# Patient Record
Sex: Female | Born: 1945 | Race: White | Hispanic: No | Smoking: Never smoker
Health system: Southern US, Community
[De-identification: ages and names within clinical notes are randomized; demographics above are authoritative.]

## PROBLEM LIST (undated history)

## (undated) DIAGNOSIS — I10 Essential (primary) hypertension: Secondary | ICD-10-CM

## (undated) DIAGNOSIS — E785 Hyperlipidemia, unspecified: Secondary | ICD-10-CM

## (undated) DIAGNOSIS — Z8249 Family history of ischemic heart disease and other diseases of the circulatory system: Secondary | ICD-10-CM

## (undated) DIAGNOSIS — Z841 Family history of disorders of kidney and ureter: Secondary | ICD-10-CM

## (undated) DIAGNOSIS — I639 Cerebral infarction, unspecified: Secondary | ICD-10-CM

## (undated) DIAGNOSIS — R9431 Abnormal electrocardiogram [ECG] [EKG]: Secondary | ICD-10-CM

## (undated) HISTORY — DX: Abnormal electrocardiogram (ECG) (EKG): R94.31

## (undated) HISTORY — DX: Family history of ischemic heart disease and other diseases of the circulatory system: Z82.49

## (undated) HISTORY — DX: Family history of disorders of kidney and ureter: Z84.1

## (undated) HISTORY — DX: Essential (primary) hypertension: I10

## (undated) HISTORY — DX: Cerebral infarction, unspecified: I63.9

## (undated) HISTORY — DX: Hyperlipidemia, unspecified: E78.5

---
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
## 2017-08-30 DIAGNOSIS — I639 Cerebral infarction, unspecified: Secondary | ICD-10-CM

## 2017-08-30 HISTORY — DX: Cerebral infarction, unspecified: I63.9

## 2017-09-12 ENCOUNTER — Observation Stay (HOSPITAL_COMMUNITY)
Admission: EM | Admit: 2017-09-12 | Discharge: 2017-09-13 | Disposition: A | Discharge status: Home / Self Care | Payer: Medicare Other | Attending: Family Medicine | Admitting: Family Medicine

## 2017-09-12 ENCOUNTER — Inpatient Hospital Stay (HOSPITAL_COMMUNITY): Payer: Medicare Other

## 2017-09-12 ENCOUNTER — Encounter (HOSPITAL_COMMUNITY): Payer: Self-pay

## 2017-09-12 ENCOUNTER — Emergency Department (HOSPITAL_COMMUNITY): Payer: Medicare Other

## 2017-09-12 ENCOUNTER — Other Ambulatory Visit: Payer: Self-pay

## 2017-09-12 DIAGNOSIS — Z841 Family history of disorders of kidney and ureter: Secondary | ICD-10-CM | POA: Insufficient documentation

## 2017-09-12 DIAGNOSIS — Z8249 Family history of ischemic heart disease and other diseases of the circulatory system: Secondary | ICD-10-CM | POA: Insufficient documentation

## 2017-09-12 DIAGNOSIS — Z8673 Personal history of transient ischemic attack (TIA), and cerebral infarction without residual deficits: Secondary | ICD-10-CM | POA: Diagnosis present

## 2017-09-12 DIAGNOSIS — R262 Difficulty in walking, not elsewhere classified: Secondary | ICD-10-CM | POA: Diagnosis not present

## 2017-09-12 DIAGNOSIS — I63422 Cerebral infarction due to embolism of left anterior cerebral artery: Principal | ICD-10-CM | POA: Insufficient documentation

## 2017-09-12 DIAGNOSIS — E785 Hyperlipidemia, unspecified: Secondary | ICD-10-CM | POA: Insufficient documentation

## 2017-09-12 DIAGNOSIS — R9431 Abnormal electrocardiogram [ECG] [EKG]: Secondary | ICD-10-CM | POA: Insufficient documentation

## 2017-09-12 DIAGNOSIS — G8191 Hemiplegia, unspecified affecting right dominant side: Secondary | ICD-10-CM | POA: Insufficient documentation

## 2017-09-12 DIAGNOSIS — I639 Cerebral infarction, unspecified: Secondary | ICD-10-CM

## 2017-09-12 DIAGNOSIS — R531 Weakness: Secondary | ICD-10-CM | POA: Diagnosis present

## 2017-09-12 DIAGNOSIS — I10 Essential (primary) hypertension: Secondary | ICD-10-CM | POA: Insufficient documentation

## 2017-09-12 HISTORY — DX: Cerebral infarction, unspecified: I63.9

## 2017-09-12 LAB — COMPREHENSIVE METABOLIC PANEL
ALBUMIN: 4.4 g/dL (ref 3.5–5.0)
ALT: 17 U/L (ref 14–54)
AST: 25 U/L (ref 15–41)
Alkaline Phosphatase: 78 U/L (ref 38–126)
Anion gap: 11 (ref 5–15)
BUN: 9 mg/dL (ref 6–20)
CALCIUM: 9.5 mg/dL (ref 8.9–10.3)
CO2: 22 mmol/L (ref 22–32)
CREATININE: 0.77 mg/dL (ref 0.44–1.00)
Chloride: 107 mmol/L (ref 101–111)
GFR calc Af Amer: >60 mL/min (ref >60)
GFR calc non Af Amer: >60 mL/min (ref >60)
Glucose, Bld: 112 mg/dL — ABNORMAL HIGH (ref 65–99)
Potassium: 3.9 mmol/L (ref 3.5–5.1)
SODIUM: 140 mmol/L (ref 135–145)
Total Bilirubin: 0.7 mg/dL (ref 0.3–1.2)
Total Protein: 8 g/dL (ref 6.5–8.1)

## 2017-09-12 LAB — I-STAT CHEM 8, ED
BUN: 11 mg/dL (ref 6–20)
CALCIUM ION: 1.13 mmol/L — AB (ref 1.15–1.40)
CHLORIDE: 106 mmol/L (ref 101–111)
CREATININE: 0.6 mg/dL (ref 0.44–1.00)
GLUCOSE: 108 mg/dL — AB (ref 65–99)
HCT: 41 % (ref 36.0–46.0)
HEMOGLOBIN: 13.9 g/dL (ref 12.0–15.0)
POTASSIUM: 3.9 mmol/L (ref 3.5–5.1)
Sodium: 142 mmol/L (ref 135–145)
TCO2: 26 mmol/L (ref 22–32)

## 2017-09-12 LAB — CBC
HCT: 41.9 % (ref 36.0–46.0)
Hemoglobin: 13.2 g/dL (ref 12.0–15.0)
MCH: 26.2 pg (ref 26.0–34.0)
MCHC: 31.5 g/dL (ref 30.0–36.0)
MCV: 83.3 fL (ref 78.0–100.0)
PLATELETS: 247 10*3/uL (ref 150–400)
RBC: 5.03 MIL/uL (ref 3.87–5.11)
RDW: 14.1 % (ref 11.5–15.5)
WBC: 6.7 10*3/uL (ref 4.0–10.5)

## 2017-09-12 LAB — DIFFERENTIAL
Basophils Absolute: 0 10*3/uL (ref 0.0–0.1)
Basophils Relative: 0 %
EOS ABS: 0 10*3/uL (ref 0.0–0.7)
EOS PCT: 0 %
Lymphocytes Relative: 14 %
Lymphs Abs: 0.9 10*3/uL (ref 0.7–4.0)
Monocytes Absolute: 0.2 10*3/uL (ref 0.1–1.0)
Monocytes Relative: 2 %
NEUTROS PCT: 84 %
Neutro Abs: 5.6 10*3/uL (ref 1.7–7.7)

## 2017-09-12 LAB — I-STAT TROPONIN, ED: TROPONIN I, POC: 0 ng/mL (ref 0.00–0.08)

## 2017-09-12 LAB — PROTIME-INR
INR: 0.93
Prothrombin Time: 12.4 seconds (ref 11.4–15.2)

## 2017-09-12 LAB — APTT: aPTT: 28 seconds (ref 24–36)

## 2017-09-12 LAB — TROPONIN I: Troponin I: <0.03 ng/mL (ref <0.03)

## 2017-09-12 LAB — CK: Total CK: 80 U/L (ref 38–234)

## 2017-09-12 MED ORDER — ASPIRIN 81 MG PO CHEW
81.0000 mg | CHEWABLE_TABLET | Freq: Every day | ORAL | Status: DC
  Filled 2017-09-12: qty 1

## 2017-09-12 MED ORDER — ENOXAPARIN SODIUM 40 MG/0.4ML ~~LOC~~ SOLN
40.0000 mg | SUBCUTANEOUS | Status: DC
  Administered 2017-09-12: 40 mg via SUBCUTANEOUS
  Filled 2017-09-12: qty 0.4

## 2017-09-12 MED ORDER — ASPIRIN 300 MG RE SUPP: 300.0000 mg | Freq: Every day | RECTAL | Status: DC

## 2017-09-12 MED ORDER — ATORVASTATIN CALCIUM 40 MG PO TABS
40.0000 mg | ORAL_TABLET | Freq: Every day | ORAL | Status: DC
  Filled 2017-09-12: qty 1

## 2017-09-12 MED ORDER — GADOBENATE DIMEGLUMINE 529 MG/ML IV SOLN
10.0000 mL | Freq: Once | INTRAVENOUS | Status: AC | PRN
  Administered 2017-09-12: 10 mL via INTRAVENOUS

## 2017-09-12 MED ORDER — STROKE: EARLY STAGES OF RECOVERY BOOK
Freq: Once | Status: AC
  Administered 2017-09-13
  Filled 2017-09-12 (×2): qty 1

## 2017-09-12 MED ORDER — ASPIRIN 325 MG PO TABS
325.0000 mg | ORAL_TABLET | Freq: Every day | ORAL | Status: DC
  Administered 2017-09-13: 325 mg via ORAL
  Filled 2017-09-12: qty 1

## 2017-09-12 NOTE — ED Triage Notes (Signed)
Pt states she went to bed last night at about 11pm and felt normal. When she woke up at 0300 to go to the restroom she noticed she had some right sided weakness and was dragging right leg when walking. Denies and numbness or tingling, visual changes, confusion. Endorses nausea. Some right arm drift noted in triage. No facial droop. Speech clear.

## 2017-09-12 NOTE — Consult Note (Signed)
Referring Physician: Zenia Resides, MD    Chief Complaint: Stroke Symptoms   HPI: Heather Robbins is an 72 y.o. female without any significant past medical history who presented to the ED with complaints of right sided weakness and ataxia.  Patient was in her usual state of health, plays ball daily; patient went to bed around 9 PM.  She woke up around 3 AM and noticed that she was having some right arm and right leg weakness, was favoring her right side with unsteady gait.  Associated with it was nausea and some dizziness but denies blurred vision or headache.  She sat in a chair woke up this morning around 730am with persistence of symptoms and ataxia.  She was trying to write and noted incoordination of her right hand, especially with fine motor skills.  She drove herself to urgent care, where she was urged to come to the ER.  She then drove here and walked to the ER, with ataxic gait and dragging of her right foot.    On admission initial CT was concerning for acute infarct in the left basal ganglia.  Neurology was consulted for evaluation of stroke.  On assessment patient is awake alert oriented x 3 with deficits of right sided ataxia, unsteady gait and right arm weakness. She denies any recent visual pain or visual loss, fevers, chills, headaches, trauma, blurred vision, chest pain, shortness of breath, vomiting and has not had any previous similar symptoms.  She admits to her maternal grandmother having history of stroke.   She has no history of autoimmune disease, no history of optic neuritis and has not had similar symptoms in the past. There is no family history of MS.    History reviewed. No pertinent past medical history.  Past Surgical History:  Procedure Laterality Date  . CESAREAN SECTION      Family History  Problem Relation Age of Onset  . Heart attack Mother   . Diabetes Mother   . Emphysema Father   . Asthma Father   . Heart attack Father   . Heart attack Maternal  Grandmother    Social History:  reports that  has never smoked. she has never used smokeless tobacco. She reports that she does not drink alcohol or use drugs.  Allergies: No Known Allergies  Medications:  .  stroke: mapping our early stages of recovery book   Does not apply Once  . aspirin  81 mg Oral Daily  . atorvastatin  40 mg Oral q1800    ROS: 13 point systems reviewed and are negative except for above  Physical Examination: Blood pressure (!) 189/89, pulse 88, temperature 98.6 F (37 C), temperature source Oral, resp. rate (!) 23, SpO2 96 %. HEENT-  Normocephalic, no lesions, without obvious abnormality.  Normal external eye and conjunctiva.   Cardiovascular- S1-S2 audible, pulses palpable throughout   Lungs-no rhonchi or wheezing noted, no excessive working breathing.  Saturations within normal limits Abdomen- All 4 quadrants palpated and nontender Musculoskeletal-no joint tenderness, deformity or swelling Skin-warm and dry  Neurological Examination Mental Status: Alert, oriented, thought content appropriate.  Speech fluent without evidence of aphasia.  Able to follow 3 step commands without difficulty. Cranial Nerves: II: Discs flat bilaterally; Visual fields grossly normal,  III,IV, VI: ptosis not present, extra-ocular motions intact bilaterally pupils equal, round, reactive to light and accommodation V,VII: smile symmetric, facial light touch sensation normal bilaterally VIII: Hearing intact to voice IX,X: uvula rises symmetrically XI: bilateral shoulder shrug XII: midline  tongue extension Motor: Mild weakness in the right upper extremities with noted drift. Right : Upper extremity   4/5    Left:     Upper extremity   5/5  Lower extremity   5/5     Lower extremity   5/5 Tone and bulk:normal tone throughout; no atrophy noted Sensory: Pinprick and light touch intact throughout, bilaterally Deep Tendon Reflexes: 3+ and symmetric throughout Plantars: Right:  downgoing  Left: downgoing Cerebellar: Ataxic right finger-to-nose, normal rapid alternating movements, normal orbiting. Ataxic right leg heel-to-shin test Gait: Drags right leg with ataxic gait  NIHSS 1a Level of Conscious.: 0 1b LOC Questions: 0 1c LOC Commands: 0 2 Best Gaze: 0 3 Visual: 0 4 Facial Palsy: 0 5a Motor Arm - left: 0 5b Motor Arm - Right: 1 6a Motor Leg - Left: 0 6b Motor Leg - Right: 0 7 Limb Ataxia: 2 8 Sensory: 0 9 Best Language: 0 10 Dysarthria: 0 11 Extinct. and Inatten.: 0 TOTAL: 3  Results for orders placed or performed during the hospital encounter of 09/12/17 (from the past 48 hour(s))  Protime-INR     Status: None   Collection Time: 09/12/17 12:33 PM  Result Value Ref Range   Prothrombin Time 12.4 11.4 - 15.2 seconds   INR 0.93     Comment: Performed at [facility], [place] [address] [place], [place] [address]  APTT     Status: None   Collection Time: 09/12/17 12:33 PM  Result Value Ref Range   aPTT 28 24 - 36 seconds    Comment: Performed at [facility] [address] [place] [address]  CBC     Status: None   Collection Time: 09/12/17 12:33 PM  Result Value Ref Range   WBC 6.7 4.0 - 10.5 K/uL   RBC 5.03 3.87 - 5.11 MIL/uL   Hemoglobin 13.2 12.0 - 15.0 g/dL   HCT 41.9 36.0 - 46.0 %   MCV 83.3 78.0 - 100.0 fL   MCH 26.2 26.0 - 34.0 pg   MCHC 31.5 30.0 - 36.0 g/dL   RDW 14.1 11.5 - 15.5 %   Platelets 247 150 - 400 K/uL    Comment: Performed at [facility] [address] [place], [place] [address]  Differential     Status: None   Collection Time: 09/12/17 12:33 PM  Result Value Ref Range   Neutrophils Relative % 84 %   Neutro Abs 5.6 1.7 - 7.7 K/uL   Lymphocytes Relative 14 %   Lymphs Abs 0.9 0.7 - 4.0 K/uL   Monocytes Relative 2 %   Monocytes Absolute 0.2 0.1 - 1.0 K/uL   Eosinophils Relative 0 %   Eosinophils Absolute 0.0 0.0 - 0.7 K/uL   Basophils Relative 0 %   Basophils Absolute 0.0 0.0 - 0.1 K/uL     Comment: Performed at [facility] [address] [place], [place] [address]  Comprehensive metabolic panel     Status: Abnormal   Collection Time: 09/12/17 12:33 PM  Result Value Ref Range   Sodium 140 135 - 145 mmol/L   Potassium 3.9 3.5 - 5.1 mmol/L   Chloride 107 101 - 111 mmol/L   CO2 22 22 - 32 mmol/L   Glucose, Bld 112 (H) 65 - 99 mg/dL   BUN 9 6 - 20 mg/dL   Creatinine, Ser 0.77 0.44 - 1.00 mg/dL   Calcium 9.5 8.9 - 10.3 mg/dL   Total  Protein 8.0 6.5 - 8.1 g/dL   Albumin 4.4 3.5 - 5.0 g/dL   AST 25 15 - 41 U/L   ALT 17 14 - 54 U/L   Alkaline Phosphatase 78 38 - 126 U/L   Total Bilirubin 0.7 0.3 - 1.2 mg/dL   GFR calc non Af Amer >60 >60 mL/min   GFR calc Af Amer >60 >60 mL/min    Comment: (NOTE) The eGFR has been calculated using the CKD EPI equation. This calculation has not been validated in all clinical situations. eGFR's persistently <60 mL/min signify possible Chronic Kidney Disease.    Anion gap 11 5 - 15    Comment: Performed at [facility] [address], [place], [place] [address]  I-stat troponin, ED     Status: None   Collection Time: 09/12/17 12:51 PM  Result Value Ref Range   Troponin i, poc 0.00 0.00 - 0.08 ng/mL   Comment 3            Comment: Due to the release kinetics of cTnI, a negative result within the first hours of the onset of symptoms does not rule out myocardial infarction with certainty. If myocardial infarction is still suspected, repeat the test at appropriate intervals.   I-Stat Chem 8, ED     Status: Abnormal   Collection Time: 09/12/17 12:57 PM  Result Value Ref Range   Sodium 142 135 - 145 mmol/L   Potassium 3.9 3.5 - 5.1 mmol/L   Chloride 106 101 - 111 mmol/L   BUN 11 6 - 20 mg/dL   Creatinine, Ser 0.60 0.44 - 1.00 mg/dL   Glucose, Bld 108 (H) 65 - 99 mg/dL   Calcium, Ion 1.13 (L) 1.15 - 1.40 mmol/L   TCO2 26 22 - 32 mmol/L   Hemoglobin 13.9 12.0 - 15.0 g/dL   HCT 41.0 36.0 - 46.0 %   Ct Head Wo  Contrast 09/12/2017 IMPRESSION:  1. Findings concerning for acute infarct in the left basal ganglia.   MRI brain:  1. Small focus of acute ischemia at the left frontoparietal junction, at the base of the central sulcus. No associated hemorrhage or mass effect. 2. Diffuse confluent white matter disease. 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 Severe chronic demyelinating disease could have this appearance, but the features are not particularly suggestive of this and there is no abnormal contrast enhancement. 3. No emergent large vessel occlusion or high-grade intracranial or cervical arterial stenosis.  MRI cervical spine: 1. No focal spinal cord lesion or evidence of demyelination. 2. 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  EKG: NSR, biatrial enlargement  Assessment: 72 y.o. female without any significant past medical history who presented to the ED with complaints of acute onset right sided weakness and ataxia. 1. MRI reveals a small focus of acute ischemia at the left frontoparietal junction, at the base of the central sulcus. No abnormal enhancement is appreciated. Severe chronic microvascular ischemic changes are also noted. MRA shows no emergent large vessel occlusion or high-grade intracranial or cervical arterial stenosis. 2. MRI findings correspond with the patient's symptoms of right-sided weakness, right-sided ataxia, and unsteady gait.   3. Patient with no significant stroke risk factors, but does have family history of stroke.  She denies recent heart fluttering.  At this time due to patient's focus of acute ischemia on MRI, we will pursue full stroke workup  with MRI of the brain, MRA of the head and neck, echocardiogram and carotid Dopplers.  We will start PT, OT and speech therapy.  We will start aspirin 81 mg as well  as intermediate dose statin for now while pending lipid panel.   4. Permissive hypertension for this first 24-48 hours per stroke protocol. 5. Stroke Risk Factors - family history  Plan: - HgbA1c, fasting lipid panel pending - PT consult, OT consult, Speech consult - Echocardiogram - Carotid dopplers - Prophylactic therapy-Antiplatelet med: Aspirin - dose 97m - Atorvastatin 411mfor now pending lipid panel - Risk factor modification - Telemetry monitoring -  Frequent neuro checks - Permissive hypertension x24 - 48hrs   PeJacob MooresNP Neuro-hospitalist Team [phone]/14/2019, 3:53 PM   Electronically signed: Dr. ErKerney Elbe

## 2017-09-12 NOTE — ED Notes (Signed)
Neuro Provider at bedside. °

## 2017-09-12 NOTE — ED Notes (Signed)
Pt transported to MRI 

## 2017-09-12 NOTE — ED Provider Notes (Signed)
[facility] EMERGENCY DEPARTMENT Provider Note   CSN: [id number] Arrival date & time: 09/12/17  1221     History   Chief Complaint Chief Complaint  Patient presents with  . Stroke Symptoms    HPI Shenise Wolgamott is a 72 y.o. female.  The history is provided by the patient. No language interpreter was used.   Naveh Rickles is a 72 y.o. female who presents to the Emergency Department complaining of stroke symptoms.  Routine state of health yesterday and even played a ball game.  She went to bed around 9 PM.  When she woke at 3 she noticed that she was having some right arm and leg weakness as well as nauseousness and unsteady gait.  No reports of fevers, chest pain, abdominal pain, vomiting, vision changes or headache.  No prior similar symptoms.  She has no medical problems and takes a baby aspirin daily.  Sxs are moderate, constant.   History reviewed. No pertinent past medical history.  Patient Active Problem List   Diagnosis Date Noted  . Stroke ([facility]) 09/12/2017  . Family history of MI (myocardial infarction)   . Family history of chronic renal disease   . ST segment depression     Past Surgical History:  Procedure Laterality Date  . CESAREAN SECTION      OB History    No data available       Home Medications    Prior to Admission medications   Not on File    Family History Family History  Problem Relation Age of Onset  . Heart attack Mother   . Diabetes Mother   . Emphysema Father   . Asthma Father   . Heart attack Father   . Heart attack Maternal Grandmother     Social History Social History   Tobacco Use  . Smoking status: Never Smoker  . Smokeless tobacco: Never Used  Substance Use Topics  . Alcohol use: No    Frequency: Never  . Drug use: No     Allergies   Patient has no known allergies.   Review of Systems Review of Systems  All other systems reviewed and are negative.    Physical Exam Updated Vital Signs BP (!)  189/89   Pulse 88   Temp 98.6 F (37 C) (Oral)   Resp (!) 23   SpO2 96%   Physical Exam  Constitutional: She is oriented to person, place, and time. She appears well-developed and well-nourished.  HENT:  Head: Normocephalic and atraumatic.  Cardiovascular: Normal rate and regular rhythm.  No murmur heard. Pulmonary/Chest: Effort normal and breath sounds normal. No respiratory distress.  Abdominal: Soft. There is no tenderness. There is no rebound and no guarding.  Musculoskeletal: She exhibits no edema or tenderness.  Neurological: She is alert and oriented to person, place, and time.  No facial asymmetry.  Visual fields grossly intact.  There is slight pronator drift of the right upper extremity.  4+ out of 5 right upper extremity strength.  4+ out of 5 right lower extremity strength.  There is mild ataxia on finger to nose with the right upper extremity.  Skin: Skin is warm and dry.  Psychiatric: She has a normal mood and affect. Her behavior is normal.  Nursing note and vitals reviewed.    ED Treatments / Results  Labs (all labs ordered are listed, but only abnormal results are displayed) Labs Reviewed  COMPREHENSIVE METABOLIC PANEL - Abnormal; Notable for the following components:  Result Value   Glucose, Bld 112 (*)    All other components within normal limits  I-STAT CHEM 8, ED - Abnormal; Notable for the following components:   Glucose, Bld 108 (*)    Calcium, Ion 1.13 (*)    All other components within normal limits  PROTIME-INR  APTT  CBC  DIFFERENTIAL  TROPONIN I  LIPID PANEL  I-STAT TROPONIN, ED    EKG  EKG Interpretation  Date/Time:  Thursday September 12 2017 12:30:18 EST Ventricular Rate:  90 PR Interval:  144 QRS Duration: 80 QT Interval:  360 QTC Calculation: 440 R Axis:   90 Text Interpretation:  Normal sinus rhythm Biatrial enlargement Rightward axis Pulmonary disease pattern ST & T wave abnormality, consider lateral ischemia Abnormal ECG no  prior available for comparison Confirmed by Quintella Reichert [phone]) on 09/12/2017 2:39:25 PM       Radiology Ct Head Wo Contrast  Result Date: 09/12/2017 CLINICAL DATA:  Right-sided weakness. EXAM: CT HEAD WITHOUT CONTRAST TECHNIQUE: Contiguous axial images were obtained from the base of the skull through the vertex without intravenous contrast. COMPARISON:  None. FINDINGS: Brain: There is loss of the normal gray-white matter differentiation within the left basal ganglia, with surrounding hypodensity likely reflecting edema. No hemorrhage, hydrocephalus, extra-axial collection or mass lesion/mass effect. Mild age related cerebral atrophy. Extensive periventricular white matter hypodensity is nonspecific, but likely reflect chronic microvascular ischemic changes. Vascular: Atherosclerotic vascular calcification of the carotid siphons. No hyperdense vessel. Skull: Normal. Negative for fracture or focal lesion. Sinuses/Orbits: No acute finding. Other: None. IMPRESSION: 1. Findings concerning for acute infarct in the left basal ganglia. These results were called by telephone at the time of interpretation on 09/12/2017 at 12:56 pm to Dr. Duffy Bruce , who verbally acknowledged these results. Electronically Signed   By: Titus Dubin M.D.   On: 09/12/2017 13:00    Procedures Procedures (including critical care time)  Medications Ordered in ED Medications   stroke: mapping our early stages of recovery book (not administered)  aspirin chewable tablet 81 mg (not administered)  atorvastatin (LIPITOR) tablet 40 mg (not administered)     Initial Impression / Assessment and Plan / ED Course  I have reviewed the triage vital signs and the nursing notes.  Pertinent labs & imaging results that were available during my care of the patient were reviewed by me and considered in my medical decision making (see chart for details).     In here for evaluation of right-sided weakness that started when she  woke at 3 AM.  Imaging does demonstrate an acute basal ganglia infarct and examination is consistent with this.  She is not a TPA candidate given duration of symptoms.  Neurology consulted regarding acute CVA.  Medicine consulted for admission for further treatment.  Patient updated of findings of studies and she is in agreement with admission.  Final Clinical Impressions(s) / ED Diagnoses   Final diagnoses:  Acute CVA (cerebrovascular accident) [facility])    ED Discharge Orders    None       Quintella Reichert, MD 09/12/17 1538

## 2017-09-12 NOTE — Progress Notes (Signed)
Pt arrived at 3w

## 2017-09-12 NOTE — H&P (Signed)
[facility] Admission History and Physical Service Pager: [phone]  Patient name: Heather Robbins Medical record number: [id number] Date of birth: [birth year] Age: 72 y.o. Gender: female  Primary Care Provider: No primary care provider on file. Consultants: Neurology Code Status: Partial (No intubation), confirmed on admission  Chief Complaint: Right-sided weakness  Assessment and Plan: Heather Robbins is a 72 y.o. female presenting with right-sided weakness and difficulty walking. No significant PMH.  Acute CVA of left basal ganglia. CT head significant for for acute infarct of left basal ganglia without hemorrhage.  Last known normal was last night around 11pm, woke up around 3am with R foot dragging and R hand weakness.  Patient presented outside the window for TPA.  Minimal R sided deficits with 4.5/5 strength in R UE and LE as well R sided finger-to-nose and heel-to-shin. Work up so far significant for EKG nonspecific ST changes in II, III, aVF and possibly in V4-6.  No prior EKG for comparison. Patient does not endorse any chest pain or shortness of breath.  Currently hypertensive at 180/90.  I-STAT troponin was negative.  Not likely ACS given patient has no chest pain or shortness of breath. -Admit to inpatient, attending Dr. Andria Frames -cardiac monitoring -MRI brain/MRA head/MRA neck -Echocardiogram -Carotid Dopplers -Risk stratification labs: TSH, A1c, lipid panel -start ASA81 and atorvastatin 40mg  qd  -Neurology consulted in ED, appreciate recommendations -Permissive hypertension -Telemetry -Vital signs per floor -PT/OT/SLP -A.m. CBC, BMP -CK  -Troponin I x1, if negative will not trend -am EKG   FEN/GI: N.p.o. pending swallow evaluation Prophylaxis: Lovenox  Disposition: Inpatient evaluation of stroke  History of Present Illness:  Heather Robbins is a 72 y.o. female presenting 1 day of right-sided weakness and difficulty walking. Yesterday evening patient  was playing softball and was her normal self when she went to bed.  Patient awoke at 3:00 am during her usual time, but said she didn't feel right.  She said that her coordination was not the "best" and that her balance was not "100%".  She said that she noticed that she was dragging her right foot around when walking.  She also noticed that she had difficulty writing with her right hand.  Patient is right-handed.  Patient denies ever having anything happen like this before.  Denies any medical history of stroke, high blood pressure, high cholesterol, diabetes, heart disease, heart attack.  She does not have a regular doctor currently and goes to urgent care, she states she last saw a PCP about a year ago.  She denies taking any medications. Patient has not fallen since this event.  She did eat some applesauce and orange juice this morning without any difficulty.  She came to the ED on her own because she was not feeling well and her symptoms did not improve.  In the ED, code stroke was initiated.  On admission, vital signs were significant for hypertension to 180/90. Patient CBC and CMP were unremarkable.  Patient had negative I stat troponin.  EKG was significant for ST depression in lateral leads and 2, 3, and aVF.  CT head was significant for acute infarct of the left basal ganglia.  Neurology was consulted in the ED.  They recommended permissive hypertension and admission for full stroke workup per the protocol.  Review Of Systems: Per HPI with the following additions:   Review of Systems  Constitutional: Negative for chills and fever.  HENT: Negative for congestion, sinus pain and sore throat.   Eyes: Negative for  blurred vision, double vision and pain.  Respiratory: Negative for cough and shortness of breath.   Cardiovascular: Negative for chest pain and palpitations.  Gastrointestinal: Positive for nausea. Negative for abdominal pain, constipation, diarrhea and vomiting.  Genitourinary: Negative  for dysuria.  Musculoskeletal: Negative for falls.  Neurological: Positive for focal weakness, weakness and headaches. Negative for dizziness, tingling and sensory change.  Psychiatric/Behavioral: Negative for substance abuse. The patient has insomnia.     Patient Active Problem List   Diagnosis Date Noted  . Stroke ([place]) 09/12/2017  . Family history of MI (myocardial infarction)   . Family history of chronic renal disease   . ST segment depression     Past Medical History: History reviewed. No pertinent past medical history.  Past Surgical History: Past Surgical History:  Procedure Laterality Date  . CESAREAN SECTION      Social History: Social History   Tobacco Use  . Smoking status: Never Smoker  . Smokeless tobacco: Never Used  Substance Use Topics  . Alcohol use: No    Frequency: Never  . Drug use: No   Additional social history:  Only hospitalization once in 1985 for C-section. No other hospitalizations.  She does not see PCP regularly. No smoking, drinking, no drugs.  She lives alone and is independent performing all of her daily functions of living without difficulty.  She lives in [place].  She has 1 daughter.   Please also refer to relevant sections of EMR.  Family History: Family History  Problem Relation Age of Onset  . Heart attack Mother   . Diabetes Mother   . Emphysema Father   . Asthma Father   . Heart attack Father   . Heart attack Maternal Grandmother    Grandmother and mother had heart attack Mom had on dialysis Father - heart attack Daughter is healthy.    Allergies and Medications: No Known Allergies No current facility-administered medications on file prior to encounter.    No current outpatient medications on file prior to encounter.    Objective: BP (!) 189/89   Pulse 88   Temp 98.6 F (37 C) (Oral)   Resp (!) 23   SpO2 96%  Exam: General: No acute distress, sitting up in bed Eyes: PERRLA, EOMI, anicteric, noninjected,  no peripheral field defects noted on exam ENTM: No oral pharyngeal lesions, moist mucous membranes Neck: No JVD, supple, no lymphadenopathy Cardiovascular: Regular rate and rhythm, no murmurs rubs or gallops Respiratory: Clear to auscultation bilaterally, normal work of breathing Gastrointestinal: Soft nontender nondistended MSK: 4/5 strength right upper extremity with flexion, 5/5 right sided strength with extension, 5/5 strength in left upper extremity with flexion and extension,  5 of 5 strength in lower extremities bilaterally Derm: Warm dry intact Neuro: Cranial nerves II through XII are grossly intact, abnormal finger to nose on right side, normal finger to nose on left, mild discordance with heel to shin on right side, normal heel to shin on left side Psych: Normal mood and affect  Labs and Imaging: CBC BMET  Recent Labs  Lab 09/12/17 1233 09/12/17 1257  WBC 6.7  --   HGB 13.2 13.9  HCT 41.9 41.0  PLT 247  --    Recent Labs  Lab 09/12/17 1233 09/12/17 1257  NA 140 142  K 3.9 3.9  CL 107 106  CO2 22  --   BUN 9 11  CREATININE 0.77 0.60  GLUCOSE 112* 108*  CALCIUM 9.5  --  Troponin (Point of Care Test) Recent Labs    09/12/17 1251  TROPIPOC 0.00    Ct Head Wo Contrast  Result Date: 09/12/2017 CLINICAL DATA:  Right-sided weakness. EXAM: CT HEAD WITHOUT CONTRAST TECHNIQUE: Contiguous axial images were obtained from the base of the skull through the vertex without intravenous contrast. COMPARISON:  None. FINDINGS: Brain: There is loss of the normal gray-white matter differentiation within the left basal ganglia, with surrounding hypodensity likely reflecting edema. No hemorrhage, hydrocephalus, extra-axial collection or mass lesion/mass effect. Mild age related cerebral atrophy. Extensive periventricular white matter hypodensity is nonspecific, but likely reflect chronic microvascular ischemic changes. Vascular: Atherosclerotic vascular calcification of the carotid  siphons. No hyperdense vessel. Skull: Normal. Negative for fracture or focal lesion. Sinuses/Orbits: No acute finding. Other: None. IMPRESSION: 1. Findings concerning for acute infarct in the left basal ganglia. These results were called by telephone at the time of interpretation on 09/12/2017 at 12:56 pm to Dr. Duffy Bruce , who verbally acknowledged these results. Electronically Signed   By: Titus Dubin M.D.   On: 09/12/2017 13:00     Bonnita Hollow, MD 09/12/2017, 3:41 PM PGY-1, [facility] Intern pager: [phone], text pages welcome  FPTS Upper-Level Resident Addendum  I have independently interviewed and examined the patient. I have discussed the above with the original author and agree with their documentation. My edits for correction/addition/clarification are in blue. Please see also any attending notes.   Bufford Lope, DO PGY-2, [facility] 09/12/2017 4:16 PM  FPTS Service pager: [phone] (text pages welcome through [facility])

## 2017-09-12 NOTE — ED Notes (Signed)
Pt returned from MRI. Pt being transported to 3w at this time.

## 2017-09-13 ENCOUNTER — Inpatient Hospital Stay (HOSPITAL_BASED_OUTPATIENT_CLINIC_OR_DEPARTMENT_OTHER): Payer: Medicare Other

## 2017-09-13 ENCOUNTER — Other Ambulatory Visit: Payer: Self-pay

## 2017-09-13 DIAGNOSIS — Z8673 Personal history of transient ischemic attack (TIA), and cerebral infarction without residual deficits: Secondary | ICD-10-CM

## 2017-09-13 DIAGNOSIS — I63422 Cerebral infarction due to embolism of left anterior cerebral artery: Secondary | ICD-10-CM | POA: Diagnosis not present

## 2017-09-13 DIAGNOSIS — I63313 Cerebral infarction due to thrombosis of bilateral middle cerebral arteries: Secondary | ICD-10-CM | POA: Diagnosis not present

## 2017-09-13 DIAGNOSIS — I639 Cerebral infarction, unspecified: Secondary | ICD-10-CM

## 2017-09-13 DIAGNOSIS — E785 Hyperlipidemia, unspecified: Secondary | ICD-10-CM | POA: Diagnosis not present

## 2017-09-13 DIAGNOSIS — I10 Essential (primary) hypertension: Secondary | ICD-10-CM | POA: Diagnosis not present

## 2017-09-13 HISTORY — DX: Personal history of transient ischemic attack (TIA), and cerebral infarction without residual deficits: Z86.73

## 2017-09-13 LAB — RAPID URINE DRUG SCREEN, HOSP PERFORMED
AMPHETAMINES: NOT DETECTED
BENZODIAZEPINES: NOT DETECTED
Barbiturates: NOT DETECTED
COCAINE: NOT DETECTED
OPIATES: NOT DETECTED
TETRAHYDROCANNABINOL: NOT DETECTED

## 2017-09-13 LAB — BASIC METABOLIC PANEL
Anion gap: 12 (ref 5–15)
BUN: 9 mg/dL (ref 6–20)
CALCIUM: 9.3 mg/dL (ref 8.9–10.3)
CHLORIDE: 103 mmol/L (ref 101–111)
CO2: 27 mmol/L (ref 22–32)
CREATININE: 0.79 mg/dL (ref 0.44–1.00)
GFR calc Af Amer: >60 mL/min (ref >60)
GFR calc non Af Amer: >60 mL/min (ref >60)
GLUCOSE: 95 mg/dL (ref 65–99)
Potassium: 3.8 mmol/L (ref 3.5–5.1)
Sodium: 142 mmol/L (ref 135–145)

## 2017-09-13 LAB — TSH: TSH: 2.553 u[IU]/mL (ref 0.350–4.500)

## 2017-09-13 LAB — CBC WITH DIFFERENTIAL/PLATELET
BASOS PCT: 0 %
Basophils Absolute: 0 10*3/uL (ref 0.0–0.1)
EOS ABS: 0 10*3/uL (ref 0.0–0.7)
Eosinophils Relative: 1 %
HEMATOCRIT: 39.8 % (ref 36.0–46.0)
HEMOGLOBIN: 12.4 g/dL (ref 12.0–15.0)
LYMPHS ABS: 1.3 10*3/uL (ref 0.7–4.0)
Lymphocytes Relative: 24 %
MCH: 26 pg (ref 26.0–34.0)
MCHC: 31.2 g/dL (ref 30.0–36.0)
MCV: 83.4 fL (ref 78.0–100.0)
MONO ABS: 0.4 10*3/uL (ref 0.1–1.0)
MONOS PCT: 7 %
NEUTROS ABS: 3.5 10*3/uL (ref 1.7–7.7)
NEUTROS PCT: 68 %
Platelets: 223 10*3/uL (ref 150–400)
RBC: 4.77 MIL/uL (ref 3.87–5.11)
RDW: 14.1 % (ref 11.5–15.5)
WBC: 5.1 10*3/uL (ref 4.0–10.5)

## 2017-09-13 LAB — LIPID PANEL
CHOLESTEROL: 260 mg/dL — AB (ref 0–200)
HDL: 46 mg/dL (ref >40)
LDL Cholesterol: 198 mg/dL — ABNORMAL HIGH (ref 0–99)
Total CHOL/HDL Ratio: 5.7 RATIO
Triglycerides: 82 mg/dL (ref <150)
VLDL: 16 mg/dL (ref 0–40)

## 2017-09-13 LAB — ECHOCARDIOGRAM COMPLETE
EERAT: 7.74
EWDT: 338 ms
FS: 38 % (ref 28–44)
IV/PV OW: 1.57
LA ID, A-P, ES: 27 mm
LA diam end sys: 27 mm
LA diam index: 1.77 cm/m2
LA vol A4C: 23.5 ml
LAVOL: 29.6 mL
LAVOLIN: 19.4 mL/m2
LV E/e' medial: 7.74
LV E/e'average: 7.74
LV PW d: 5.6 mm — AB (ref 0.6–1.1)
LV TDI E'MEDIAL: 5.77
LV e' LATERAL: 9.46 cm/s
LVOT area: 3.14 cm2
LVOT diameter: 20 mm
Lateral S' vel: 9.68 cm/s
MV Dec: 338
MV pk A vel: 79.5 m/s
MVPG: 2 mmHg
MVPKEVEL: 73.2 m/s
RV TAPSE: 19.9 mm
TDI e' lateral: 9.46

## 2017-09-13 LAB — HEMOGLOBIN A1C
Hgb A1c MFr Bld: 5.8 % — ABNORMAL HIGH (ref 4.8–5.6)
Mean Plasma Glucose: 119.76 mg/dL

## 2017-09-13 MED ORDER — ATORVASTATIN CALCIUM 80 MG PO TABS: 80.0000 mg | ORAL_TABLET | Freq: Every day | ORAL | 0 refills | Status: DC

## 2017-09-13 MED ORDER — ASPIRIN 325 MG PO TABS: 325.0000 mg | ORAL_TABLET | Freq: Every day | ORAL | 0 refills | Status: DC

## 2017-09-13 MED ORDER — ATORVASTATIN CALCIUM 80 MG PO TABS
80.0000 mg | ORAL_TABLET | Freq: Every day | ORAL | Status: DC
  Administered 2017-09-13: 80 mg via ORAL
  Filled 2017-09-13: qty 1

## 2017-09-13 NOTE — Evaluation (Signed)
Occupational Therapy Evaluation Patient Details Name: Heather Robbins MRN: [id number] DOB: 1945-10-27 Today's Date: 09/13/2017    History of Present Illness 72 y.o. female without any significant past medical history who presented to the ED with complaints of right sided weakness and ataxia. MRI reveals a small focus of acute ischemia at the left frontoparietal junction, at the base of the central sulcus.    Clinical Impression   This 72 y/o F presents with the above. Pt lives alone, at baseline is independent with ADLs, iADLs and functional mobility, very active. Pt completing room and hallway level functional mobility, functional transfers without AD with overall supervision-MinGuard during session; Demonstrating LB ADLs with MinGuard assist. Pt presenting with decreased coordination in RUE. Education provided on activities for improving RUE FMC/strengthening and handout provided. Will continue to follow while in acute setting to address higher level balance as well as to review RUE coordination/HEP. Recommend outpatient neuro OT after discharge to further address RUE deficits.     Follow Up Recommendations  Outpatient OT;Supervision - Intermittent(neuro outpatient OT )    Equipment Recommendations  None recommended by OT           Precautions / Restrictions Precautions Precautions: None      Mobility Bed Mobility Overal bed mobility: Modified Independent                Transfers Overall transfer level: Needs assistance Equipment used: None Transfers: Sit to/from Stand Sit to Stand: Supervision         General transfer comment: supervision for safety     Balance Overall balance assessment: Mild deficits observed, not formally tested                                       ADL either performed or assessed with clinical judgement   ADL Overall ADL's : Needs assistance/impaired Eating/Feeding: Modified independent;Sitting   Grooming:  Supervision/safety;Standing   Upper Body Bathing: Supervision/ safety;Sitting   Lower Body Bathing: Min guard;Sit to/from stand   Upper Body Dressing : Supervision/safety;Sitting   Lower Body Dressing: Min guard;Sit to/from stand Lower Body Dressing Details (indicate cue type and reason): Pt easily able to reach and adjust socks while seated EOB  Toilet Transfer: Min guard;Ambulation;Regular Museum/gallery exhibitions officer and Hygiene: Min guard;Sit to/from stand   Tub/ Shower Transfer: Tub transfer;Min guard;Ambulation Tub/Shower Transfer Details (indicate cue type and reason): educated Pt on use of shower seat and to have seat in tub in case of instance of unsteadiness/needing to sit during task completion with Pt verbalizing understanding  Functional mobility during ADLs: Min guard General ADL Comments: MinGuard during hallway level functional mobility as Pt slightly unsteady at times; educated on RUE coordination and strengthening activities/exercises with coordination handout provided and reviewed      Vision Baseline Vision/History: Wears glasses Wears Glasses: Reading only Patient Visual Report: No change from baseline Vision Assessment?: No apparent visual deficits                Pertinent Vitals/Pain Pain Assessment: No/denies pain     Hand Dominance Right   Extremity/Trunk Assessment Upper Extremity Assessment Upper Extremity Assessment: RUE deficits/detail RUE Deficits / Details: ROM WFL, grossly 3+/5, decreased coordination, decreased smoothness/acurracy during finger to nose  RUE Coordination: decreased fine motor   Lower Extremity Assessment Lower Extremity Assessment: Defer to PT evaluation RLE Deficits / Details: 5/5 strength, c/o mild tingling  in foot RLE Sensation: decreased proprioception   Cervical / Trunk Assessment Cervical / Trunk Assessment: Normal   Communication Communication Communication: No difficulties   Cognition  Arousal/Alertness: Awake/alert Behavior During Therapy: WFL for tasks assessed/performed Overall Cognitive Status: Within Functional Limits for tasks assessed                                           Exercises Other Exercises Other Exercises: educated on RUE fine motor coordination activities/exercises and UE ROM/strengthening exercises with pt return demonstrating with min verbal cues          Home Living Family/patient expects to be discharged to:: Private residence Living Arrangements: Alone Available Help at Discharge: Family;Available PRN/intermittently Type of Home: House Home Access: Level entry     Home Layout: One level     Bathroom Shower/Tub: Teacher, early years/pre: Standard     Home Equipment: Cane - single point          Prior Functioning/Environment Level of Independence: Independent        Comments: Very active. Drives. Plays pickle ball daily.         OT Problem List: Decreased coordination;Impaired balance (sitting and/or standing)      OT Treatment/Interventions: Self-care/ADL training;DME and/or AE instruction;Therapeutic activities;Balance training;Therapeutic exercise;Energy conservation;Patient/family education    OT Goals(Current goals can be found in the care plan section) Acute Rehab OT Goals Patient Stated Goal: return to active lifestyle OT Goal Formulation: With patient Time For Goal Achievement: 09/27/17 Potential to Achieve Goals: Good  OT Frequency: Min 2X/week                             AM-PAC PT "6 Clicks" Daily Activity     Outcome Measure Help from another person eating meals?: None Help from another person taking care of personal grooming?: None Help from another person toileting, which includes using toliet, bedpan, or urinal?: None Help from another person bathing (including washing, rinsing, drying)?: A Little Help from another person to put on and taking off regular upper  body clothing?: None Help from another person to put on and taking off regular lower body clothing?: A Little 6 Click Score: 22   End of Session Equipment Utilized During Treatment: Gait belt Nurse Communication: Mobility status  Activity Tolerance: Patient tolerated treatment well Patient left: in bed;with call bell/phone within reach;with family/visitor present  OT Visit Diagnosis: Other symptoms and signs involving the nervous system (R29.898)                Time: 1410-1437 OT Time Calculation (min): 27 min Charges:  OT General Charges $OT Visit: 1 Visit OT Evaluation $OT Eval Low Complexity: 1 Low OT Treatments $Self Care/Home Management : 8-22 mins G-Codes:     Lou Cal, OT Pager [phone] 09/13/2017   Raymondo Band 09/13/2017, 3:01 PM

## 2017-09-13 NOTE — Plan of Care (Signed)
Adequate for Discharge Education: Knowledge of General Education information will improve 09/13/2017 1652 - Adequate for Discharge by Evalee Jefferson, RN 09/13/2017 1629 - Progressing by Evalee Jefferson, RN Health Behavior/Discharge Planning: Ability to manage health-related needs will improve 09/13/2017 1652 - Adequate for Discharge by Evalee Jefferson, RN 09/13/2017 1629 - Progressing by Evalee Jefferson, RN Clinical Measurements: Ability to maintain clinical measurements within normal limits will improve 09/13/2017 1652 - Adequate for Discharge by Evalee Jefferson, RN 09/13/2017 1629 - Progressing by Evalee Jefferson, RN Will remain free from infection 09/13/2017 1652 - Adequate for Discharge by Evalee Jefferson, RN 09/13/2017 1629 - Progressing by Evalee Jefferson, RN Diagnostic test results will improve 09/13/2017 1652 - Adequate for Discharge by Evalee Jefferson, RN 09/13/2017 1629 - Progressing by Evalee Jefferson, RN Respiratory complications will improve 09/13/2017 1652 - Adequate for Discharge by Evalee Jefferson, RN 09/13/2017 1629 - Progressing by Evalee Jefferson, RN Cardiovascular complication will be avoided 09/13/2017 1652 - Adequate for Discharge by Evalee Jefferson, RN 09/13/2017 1629 - Progressing by Evalee Jefferson, RN Activity: Risk for activity intolerance will decrease 09/13/2017 1652 - Adequate for Discharge by Evalee Jefferson, RN 09/13/2017 1629 - Progressing by Evalee Jefferson, RN Nutrition: Adequate nutrition will be maintained 09/13/2017 1652 - Adequate for Discharge by Evalee Jefferson, RN 09/13/2017 1629 - Progressing by Evalee Jefferson, RN Coping: Level of anxiety will decrease 09/13/2017 1652 - Adequate for Discharge by Evalee Jefferson, RN 09/13/2017 1629 - Progressing by Evalee Jefferson, RN Elimination: Will not experience complications  related to bowel motility 09/13/2017 1652 - Adequate for Discharge by Evalee Jefferson, RN 09/13/2017 1629 - Progressing by Evalee Jefferson, RN Will not experience complications related to urinary retention 09/13/2017 1652 - Adequate for Discharge by Evalee Jefferson, RN 09/13/2017 1629 - Progressing by Evalee Jefferson, RN Pain Managment: General experience of comfort will improve 09/13/2017 1652 - Adequate for Discharge by Evalee Jefferson, RN 09/13/2017 1629 - Progressing by Evalee Jefferson, RN Safety: Ability to remain free from injury will improve 09/13/2017 1652 - Adequate for Discharge by Evalee Jefferson, RN 09/13/2017 1629 - Progressing by Evalee Jefferson, RN Skin Integrity: Risk for impaired skin integrity will decrease 09/13/2017 1652 - Adequate for Discharge by Evalee Jefferson, RN 09/13/2017 1629 - Progressing by Evalee Jefferson, RN Education: Knowledge of disease or condition will improve 09/13/2017 1652 - Adequate for Discharge by Evalee Jefferson, RN 09/13/2017 1629 - Progressing by Evalee Jefferson, RN Knowledge of secondary prevention will improve 09/13/2017 1652 - Adequate for Discharge by Evalee Jefferson, RN 09/13/2017 1629 - Progressing by Evalee Jefferson, RN Knowledge of patient specific risk factors addressed and post discharge goals established will improve 09/13/2017 1652 - Adequate for Discharge by Evalee Jefferson, RN 09/13/2017 1629 - Progressing by Evalee Jefferson, RN Coping: Will verbalize positive feelings about self 09/13/2017 1652 - Adequate for Discharge by Evalee Jefferson, RN 09/13/2017 1629 - Progressing by Evalee Jefferson, RN Will identify appropriate support needs 09/13/2017 1652 - Adequate for Discharge by Evalee Jefferson, RN 09/13/2017 1629 - Progressing by Evalee Jefferson, RN Health Behavior/Discharge Planning: Ability to manage  health-related needs will improve 09/13/2017 1652 - Adequate for Discharge by Evalee Jefferson, RN 09/13/2017 1629 - Progressing by Evalee Jefferson, RN Self-Care: Ability to participate in self-care as condition permits will improve 09/13/2017 1652 -  Adequate for Discharge by Evalee Jefferson, RN 09/13/2017 1629 - Progressing by Evalee Jefferson, RN Verbalization of feelings and concerns over difficulty with self-care will improve 09/13/2017 1652 - Adequate for Discharge by Evalee Jefferson, RN 09/13/2017 1629 - Progressing by Evalee Jefferson, RN Nutrition: Risk of aspiration will decrease 09/13/2017 1652 - Adequate for Discharge by Evalee Jefferson, RN 09/13/2017 1629 - Progressing by Evalee Jefferson, RN

## 2017-09-13 NOTE — Discharge Summary (Signed)
[facility] Discharge Summary  Patient name: Heather Robbins Medical record number: [id number] Date of birth: February 14, 1946 Age: 72 y.o. Gender: female Date of Admission: 09/12/2017  Date of Discharge: 09/13/2017 Admitting Physician: Heather Resides, MD  Primary Care Provider: Patient, No Pcp Per Consultants: Neurology  Indication for Hospitalization:   CVA Kurtis Bushman)  Discharge Diagnoses/Problem List:   Left CVA of the frontoparietal region  Disposition: home  Discharge Condition: medically stable  Discharge Exam:   Gen: Alert and Oriented x 3, NAD HEENT: Normocephalic, atraumatic, PERRLA, EOMI CV: RRR, no murmurs, normal S1, S2 split, +2 pulses dorsalis pedis bilaterally Resp: CTAB, no wheezing, rales, or rhonchi, comfortable work of breathing Abd: non-distended, non-tender, soft, +bs in all four quadrants MSK: FROM in all four extremities; grip strength 5/5 bilaterally in UE and LE Ext: no clubbing, cyanosis, or edema Neuro: CN II-XII intact, no focal or gross deficits Skin: warm, dry, intact, no rashes  Brief Hospital Course:  Mrs Heather Robbins is a 72y/o female who presented to the ED due to right sided weakness and difficult walking noticing a slight dragging of her right foot when she woke up at 3am on 2/14. She had a CT of her head that showed evidence of acute infarct in the left basal ganglia and MRI showed evidence of acute infarct at the left frontoparietal junction. MRA of the neck showed normal right and left carotid system with no stenosis. Neurology was consulted and due ASA 325mg  daily was started along with Lipitor 80mg . Her Echo was normal but could not exclude a patent foramen ovale. They also recommended outpatient 30 day event monitor to rule out Atrial Fibrillation.   She was also noted to have an elevated blood pressure on admission and has not taken any medications or been diagnosed with HTN before in the past. We did not start an agent  due to allowing for 48hours of permissive hypertension while in the hospital.   On the morning of 2/15 she felt well, had no complaints other than she continued to experience a feeling of "heaviness" in her right arm however her strength on her right side was not grossly abnormal. PT/OT evaluated her and recommended outpatient physical therapy. She was medically cleared to be discharged home on 09/13/17.   Issues for Follow Up:  1. HTN - Blood pressures were consistently elevated on admission. No BP agent was started due to permissive hypertension following stroke. However, if her BP in clinic is elevated she should be started on a medication for HTN. 2. Neuro recommended 30 day event monitor. Cardiology is setting it up. She should receive it Tuesday (2/19) or Wednesday (2/20). 3. Started on ASA 325mg  and Lipitor 80mg . 4. Patient will receive outpatient PT/OT, please ensure she receives this.  Significant Procedures:   Echocardiogram  Significant Labs and Imaging:  Recent Labs  Lab 09/12/17 1233 09/12/17 1257 09/13/17 0730  WBC 6.7  --  5.1  HGB 13.2 13.9 12.4  HCT 41.9 41.0 39.8  PLT 247  --  223   Recent Labs  Lab 09/12/17 1233 09/12/17 1257 09/13/17 0730  NA 140 142 142  K 3.9 3.9 3.8  CL 107 106 103  CO2 22  --  27  GLUCOSE 112* 108* 95  BUN 9 11 9   CREATININE 0.77 0.60 0.79  CALCIUM 9.5  --  9.3  ALKPHOS 78  --   --   AST 25  --   --   ALT 17  --   --  ALBUMIN 4.4  --   --    2/14 - Troponin I: <0.03 2/14 - CK: 80 2/14 - Lipid: Tot. Chol. 260, Tri. 82, HDL 46, LDL 198 2/14 - UDS: none 2/14 - TSH: 2.553 2/14 - HgbA1c: 5.8  2/15 - CT Head w/o Contrast: IMPRESSION: 1. Findings concerning for acute infarct in the left basal ganglia.  2/15 - MRA of Head and Neck:  IMPRESSION: 1. Small focus of acute ischemia at the left frontoparietal junction, at the base of the central sulcus. No associated hemorrhage or mass effect. 2. Diffuse confluent white matter  disease. 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 Severe chronic demyelinating disease could have this appearance, but the features are not particularly suggestive of this and there is no abnormal contrast enhancement. 3. No emergent large vessel occlusion or high-grade intracranial or cervical arterial stenosis.  2/15 - MRI Cervical Spine: IMPRESSION: 1. No focal spinal cord lesion or evidence of demyelination. 2. 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  2/15 - Echo: Left ventricle abnormal septal motion, normal cavity size with estimated 55% EF. Normal wall motion. Patent foramen ovale cannot be excluded, trivial pericardial effusion was identified.  Results/Tests Pending at Time of Discharge:   None  Discharge Medications:  Allergies as of 09/13/2017   No Known Allergies     Medication List    TAKE these medications   aspirin 325 MG tablet Take 1 tablet (325 mg total) by mouth daily. Start taking on:  09/14/2017   atorvastatin 80 MG tablet Commonly known as:  LIPITOR Take 1 tablet (80 mg total) by mouth daily at 6 PM.       Discharge Instructions: Please refer to Patient Instructions section of EMR for full details.  Patient was counseled important signs and symptoms that should prompt return to medical care, changes in medications, dietary instructions, activity restrictions, and follow up appointments.   Follow-Up Appointments:   Nuala Alpha, DO 09/13/2017, 11:52 PM PGY-1, [place]

## 2017-09-13 NOTE — Care Management Obs Status (Signed)
[place] NOTIFICATION   Patient Details  Name: Heather Robbins MRN: [id number] Date of Birth: 07/18/1946   Medicare Observation Status Notification Given:  Yes    Pollie Friar, RN 09/13/2017, 5:04 PM

## 2017-09-13 NOTE — Discharge Instructions (Signed)
It was a pleasure taking care of you during this hospitalization. The neurologist recommended that you have a 30 day heart monitor to make sure that your stroke was not caused by an abnormal heart rhythm. The cardiology office will call you on Tuesday or Wednesday to get this set up.  We started you on Aspirin 325mg  daily and Lipitor 80mg  daily (cholesterol medication). Please take the Lipitor every night before bed.

## 2017-09-13 NOTE — Progress Notes (Signed)
  Echocardiogram 2D Echocardiogram has been performed.  Heather Robbins G Lawrence Roldan 09/13/2017, 11:15 AM

## 2017-09-13 NOTE — Progress Notes (Signed)
NEUROHOSPITALISTS STROKE TEAM - DAILY PROGRESS NOTE   ADMISSION HISTORY: Heather Robbins is an 72 y.o. female without any significant past medical history who presented to the ED with complaints of right sided weakness and ataxia.  Patient was in her usual state of health, plays ball daily; patient went to bed around 9 PM.  She woke up around 3 AM and noticed that she was having some right arm and right leg weakness, was favoring her right side with unsteady gait.  Associated with it was nausea and some dizziness but denies blurred vision or headache.  She sat in a chair woke up this morning around 730am with persistence of symptoms and ataxia.  She was trying to write and noted incoordination of her right hand, especially with fine motor skills.  She drove herself to urgent care, where she was urged to come to the ER.  She then drove here and walked to the ER, with ataxic gait and dragging of her right foot.    On admission initial CT was concerning for acute infarct in the left basal ganglia.  Neurology was consulted for evaluation of stroke.  On assessment patient is awake alert oriented x 3 with deficits of right sided ataxia, unsteady gait and right arm weakness. She denies any recent visual pain or visual loss, fevers, chills, headaches, trauma, blurred vision, chest pain, shortness of breath, vomiting and has not had any previous similar symptoms.  She admits to her maternal grandmother having history of stroke.   She has no history of autoimmune disease, no history of optic neuritis and has not had similar symptoms in the past. There is no family history of MS.   NIHSS 1a Level of Conscious.: 0 1b LOC Questions: 0 1c LOC Commands: 0 2 Best Gaze: 0 3 Visual: 0 4 Facial Palsy: 0 5a Motor Arm - left: 0 5b Motor Arm - Right: 1 6a Motor Leg - Left: 0 6b Motor Leg - Right: 0 7 Limb Ataxia: 2 8 Sensory: 0 9 Best Language: 0 10 Dysarthria:  0 11 Extinct. and Inatten.: 0 TOTAL: 3  SUBJECTIVE (INTERVAL HISTORY) Family is at the bedside. Patient is found laying in bed in NAD. Overall she feels her condition is gradually improving. Voices no new complaints. No new events reported overnight.   OBJECTIVE Lab Results: CBC:  Recent Labs  Lab 09/12/17 1233 09/12/17 1257 09/13/17 0730  WBC 6.7  --  5.1  HGB 13.2 13.9 12.4  HCT 41.9 41.0 39.8  MCV 83.3  --  83.4  PLT 247  --  223   BMP: Recent Labs  Lab 09/12/17 1233 09/12/17 1257 09/13/17 0730  NA 140 142 142  K 3.9 3.9 3.8  CL 107 106 103  CO2 22  --  27  GLUCOSE 112* 108* 95  BUN 9 11 9   CREATININE 0.77 0.60 0.79  CALCIUM 9.5  --  9.3   Liver Function Tests:  Recent Labs  Lab 09/12/17 1233  AST 25  ALT 17  ALKPHOS 78  BILITOT 0.7  PROT 8.0  ALBUMIN 4.4   No results for input(s): LIPASE, AMYLASE in the last 168 hours. No results for input(s): AMMONIA  in the last 168 hours. Thyroid Function Studies:  Recent Labs    09/13/17 0309  TSH 2.553   Cardiac Enzymes:  Recent Labs  Lab 09/12/17 1845  CKTOTAL 52  TROPONINI <0.03   No results for input(s): PROBNP in the last 168 hours. Coagulation Studies:  Recent Labs    09/12/17 1233  APTT 28  INR 0.93   No results for input(s): DDIMER in the last 72 hours. [place] Work -Up: No results for input(s): VITAMINB12, FOLATE, IRON, RETICCTPCT in the last 72 hours. Microbiology: No results found for this or any previous visit (from the past 240 hour(s)). Urinalysis: No results for input(s): COLORURINE, APPEARANCEUR, LABSPEC, PHURINE, GLUCOSEU, HGBUR, BILIRUBINUR, KETONESUR, PROTEINUR, UROBILINOGEN, NITRITE, LEUKOCYTESUR in the last 168 hours. Urine Drug Screen:     Component Value Date/Time   LABOPIA NONE DETECTED 09/13/2017 0226   COCAINSCRNUR NONE DETECTED 09/13/2017 0226   LABBENZ NONE DETECTED 09/13/2017 0226   AMPHETMU NONE DETECTED 09/13/2017 0226   THCU NONE DETECTED 09/13/2017 0226   LABBARB  NONE DETECTED 09/13/2017 0226    Alcohol Level: No results for input(s): ETH in the last 168 hours.  PHYSICAL EXAM Temp:  [98.2 F (36.8 C)-98.7 F (37.1 C)] 98.6 F (37 C) (02/15 0955) Pulse Rate:  [63-76] 68 (02/15 0955) Resp:  [14-16] 15 (02/15 0955) BP: (165-195)/(71-92) 188/83 (02/15 0955) SpO2:  [97 %-100 %] 99 % (02/15 0955) General - Well nourished, well developed, in no apparent distress HEENT-  Normocephalic,  Cardiovascular - Regular rate and rhythm  Respiratory - Lungs clear bilaterally. No wheezing. Abdomen - soft and non-tender, BS normal Extremities- no edema or cyanosis Mental Status: Alert, oriented, thought content appropriate.  Speech fluent without evidence of aphasia.  Able to follow 3 step commands without difficulty. Cranial Nerves: II: Discs flat bilaterally; Visual fields grossly normal,  III,IV, VI: ptosis not present, extra-ocular motions intact bilaterally pupils equal, round, reactive to light and accommodation V,VII: smile symmetric, facial light touch sensation normal bilaterally VIII: Hearing intact to voice IX,X: uvula rises symmetrically XI: bilateral shoulder shrug XII: midline tongue extension Motor: Mild weakness in the right upper extremities with noted drift. Right : Upper extremity   4+/5    Left:     Upper extremity   5/5  Lower extremity   5/5     Lower extremity   5/5 Tone and bulk:normal tone throughout; no atrophy noted Sensory: Pinprick and light touch intact throughout, bilaterally Deep Tendon Reflexes: 3+ and symmetric throughout Plantars: Right: downgoing  Left: downgoing Cerebellar: Ataxic right finger-to-nose, normal rapid alternating movements, normal orbiting. Ataxic right leg heel-to-shin test Gait: not tested  IMAGING: I have personally reviewed the radiological images below and agree with the radiology interpretations. Ct Head Wo Contrast  Result Date: 09/12/2017 CLINICAL DATA:  Right-sided weakness. EXAM: CT HEAD  WITHOUT CONTRAST TECHNIQUE: Contiguous axial images were obtained from the base of the skull through the vertex without intravenous contrast. COMPARISON:  None. FINDINGS: Brain: There is loss of the normal gray-white matter differentiation within the left basal ganglia, with surrounding hypodensity likely reflecting edema. No hemorrhage, hydrocephalus, extra-axial collection or mass lesion/mass effect. Mild age related cerebral atrophy. Extensive periventricular white matter hypodensity is nonspecific, but likely reflect chronic microvascular ischemic changes. Vascular: Atherosclerotic vascular calcification of the carotid siphons. No hyperdense vessel. Skull: Normal. Negative for fracture or focal lesion. Sinuses/Orbits: No acute finding. Other: None. IMPRESSION: 1. Findings concerning for acute infarct in the left basal ganglia. These results were called by  telephone at the time of interpretation on 09/12/2017 at 12:56 pm to Dr. Duffy Bruce , who verbally acknowledged these results. Electronically Signed   By: Titus Dubin M.D.   On: 09/12/2017 13:00   Mr Jodene Nam Neck W Wo Contrast  Result Date: 09/12/2017 CLINICAL DATA:  Right-sided weakness and difficulty walking. EXAM: MR HEAD WITHOUT AND WITH CONTRAST MR CIRCLE OF WILLIS WITHOUT CONTRAST MRA OF THE NECK WITHOUT AND WITH CONTRAST TECHNIQUE: Multiplanar, multiecho pulse sequences of the brain, circle of willis and surrounding structures were obtained without and with intravenous contrast. Angiographic images of the neck were obtained using MRA technique without and with intravenous contrast. CONTRAST:  63mL MULTIHANCE GADOBENATE DIMEGLUMINE 529 MG/ML IV SOLN COMPARISON:  Head CT 09/12/2017 FINDINGS: MRI HEAD FINDINGS Brain: The midline structures are normal. Small focus of abnormal diffusion restriction at the frontoparietal junction, just at the base of the left central sulcus. Faint area of hyperintense signal on diffusion-weighted imaging in the right  parietal white matter is not confirmed on ADC map. There is diffuse confluent hyperintense T2-weighted signal within the periventricular, deep and juxtacortical white matter, most often a result of chronic microvascular ischemia. No mass lesion. No chronic microhemorrhage or cerebral amyloid angiopathy. No hydrocephalus, age advanced atrophy or lobar predominant volume loss. No dural abnormality or extra-axial collection. Skull and upper cervical spine: The visualized skull base, calvarium, upper cervical spine and extracranial soft tissues are normal. Sinuses/Orbits: No fluid levels or advanced mucosal thickening. No mastoid effusion. Normal orbits. MRA HEAD FINDINGS Intracranial internal carotid arteries: Normal. Anterior cerebral arteries: Normal. Middle cerebral arteries: Normal. Posterior communicating arteries: Absent bilaterally. Posterior cerebral arteries: Normal. Basilar artery: Normal. Vertebral arteries: Left dominant. Normal. Superior cerebellar arteries: Normal. Anterior inferior cerebellar arteries: Normal. Posterior inferior cerebellar arteries: Normal left. Not clearly seen on the right. MRA NECK FINDINGS Aortic arch: Normal 3 vessel aortic branching pattern. The visualized subclavian arteries are normal. Right carotid system: Normal course and caliber without stenosis or evidence of dissection. Left carotid system: Normal course and caliber without stenosis or evidence of dissection. Vertebral arteries: Codominant. Vertebral artery origins are normal. Vertebral arteries are normal in course and caliber to the vertebrobasilar confluence without stenosis or evidence of dissection. IMPRESSION: 1. Small focus of acute ischemia at the left frontoparietal junction, at the base of the central sulcus. No associated hemorrhage or mass effect. 2. Diffuse confluent white matter disease. 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 Severe chronic demyelinating  disease could have this appearance, but the features are not particularly suggestive of this and there is no abnormal contrast enhancement. 3. No emergent large vessel occlusion or high-grade intracranial or cervical arterial stenosis. Electronically Signed   By: Ulyses Jarred M.D.   On: 09/12/2017 18:20   Mr Jeri Cos VO Contrast  Result Date: 09/12/2017 CLINICAL DATA:  Right-sided weakness and difficulty walking. EXAM: MR HEAD WITHOUT AND WITH CONTRAST MR CIRCLE OF WILLIS WITHOUT CONTRAST MRA OF THE NECK WITHOUT AND WITH CONTRAST TECHNIQUE: Multiplanar, multiecho pulse sequences of the brain, circle of willis and surrounding structures were obtained without and with intravenous contrast. Angiographic images of the neck were obtained using MRA technique without and with intravenous contrast. CONTRAST:  85mL MULTIHANCE GADOBENATE DIMEGLUMINE 529 MG/ML IV SOLN COMPARISON:  Head CT 09/12/2017 FINDINGS: MRI HEAD FINDINGS Brain: The midline structures are normal. Small focus of abnormal diffusion restriction at the frontoparietal junction, just at the base of the left central sulcus. Faint area of hyperintense  signal on diffusion-weighted imaging in the right parietal white matter is not confirmed on ADC map. There is diffuse confluent hyperintense T2-weighted signal within the periventricular, deep and juxtacortical white matter, most often a result of chronic microvascular ischemia. No mass lesion. No chronic microhemorrhage or cerebral amyloid angiopathy. No hydrocephalus, age advanced atrophy or lobar predominant volume loss. No dural abnormality or extra-axial collection. Skull and upper cervical spine: The visualized skull base, calvarium, upper cervical spine and extracranial soft tissues are normal. Sinuses/Orbits: No fluid levels or advanced mucosal thickening. No mastoid effusion. Normal orbits. MRA HEAD FINDINGS Intracranial internal carotid arteries: Normal. Anterior cerebral arteries: Normal. Middle  cerebral arteries: Normal. Posterior communicating arteries: Absent bilaterally. Posterior cerebral arteries: Normal. Basilar artery: Normal. Vertebral arteries: Left dominant. Normal. Superior cerebellar arteries: Normal. Anterior inferior cerebellar arteries: Normal. Posterior inferior cerebellar arteries: Normal left. Not clearly seen on the right. MRA NECK FINDINGS Aortic arch: Normal 3 vessel aortic branching pattern. The visualized subclavian arteries are normal. Right carotid system: Normal course and caliber without stenosis or evidence of dissection. Left carotid system: Normal course and caliber without stenosis or evidence of dissection. Vertebral arteries: Codominant. Vertebral artery origins are normal. Vertebral arteries are normal in course and caliber to the vertebrobasilar confluence without stenosis or evidence of dissection. IMPRESSION: 1. Small focus of acute ischemia at the left frontoparietal junction, at the base of the central sulcus. No associated hemorrhage or mass effect. 2. Diffuse confluent white matter disease. 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 Severe chronic demyelinating disease could have this appearance, but the features are not particularly suggestive of this and there is no abnormal contrast enhancement. 3. No emergent large vessel occlusion or high-grade intracranial or cervical arterial stenosis. Electronically Signed   By: Ulyses Jarred M.D.   On: 09/12/2017 18:20   Mr Cervical Spine W Contrast  Result Date: 09/12/2017 CLINICAL DATA:  Difficulty walking.  Right-sided weakness. EXAM: MRI CERVICAL SPINE WITH CONTRAST TECHNIQUE: Multiplanar, multisequence MR imaging of the cervical spine was performed following the administration of intravenous contrast. COMPARISON:  None. FINDINGS: Alignment: Normal Vertebrae: Mildly heterogeneous marrow signal.  No acute lesion. Cord: Normal caliber and signal.  No contrast  enhancement. Posterior Fossa, vertebral arteries, paraspinal tissues: Negative. Disc levels: C1-C2: Unremarkable C2-C3: Small central disc extrusion with inferior migration. No associated stenosis. C3-C4: Normal disc space and facets. No spinal canal or neuroforaminal stenosis. C4-C5: Severe left facet hypertrophy and mild left uncovertebral hypertrophy combine to severely narrows the left neural foramen. C5-C6: Bilateral uncovertebral hypertrophy and left-greater-than-right facet hypertrophy with mild bilateral foraminal stenosis. C6-C7: Central disc protrusion and mild uncovertebral spurring. No spinal canal stenosis. Moderate right and mild left neural foraminal stenosis. C7-T1: Normal disc space and facets. No spinal canal or neuroforaminal stenosis. IMPRESSION: 1. No focal spinal cord lesion or evidence of demyelination. 2. 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 Electronically Signed   By: Ulyses Jarred M.D.   On: 09/12/2017 18:34   Mr Jodene Nam Head Wo Contrast  Result Date: 09/12/2017 CLINICAL DATA:  Right-sided weakness and difficulty walking. EXAM: MR HEAD WITHOUT AND WITH CONTRAST MR CIRCLE OF WILLIS WITHOUT CONTRAST MRA OF THE NECK WITHOUT AND WITH CONTRAST TECHNIQUE: Multiplanar, multiecho pulse sequences of the brain, circle of willis and surrounding structures were obtained without and with intravenous contrast. Angiographic images of the neck were obtained using MRA technique without and with intravenous  contrast. CONTRAST:  49mL MULTIHANCE GADOBENATE DIMEGLUMINE 529 MG/ML IV SOLN COMPARISON:  Head CT 09/12/2017 FINDINGS: MRI HEAD FINDINGS Brain: The midline structures are normal. Small focus of abnormal diffusion restriction at the frontoparietal junction, just at the base of the left central sulcus. Faint area of hyperintense signal on diffusion-weighted imaging in the right parietal white matter  is not confirmed on ADC map. There is diffuse confluent hyperintense T2-weighted signal within the periventricular, deep and juxtacortical white matter, most often a result of chronic microvascular ischemia. No mass lesion. No chronic microhemorrhage or cerebral amyloid angiopathy. No hydrocephalus, age advanced atrophy or lobar predominant volume loss. No dural abnormality or extra-axial collection. Skull and upper cervical spine: The visualized skull base, calvarium, upper cervical spine and extracranial soft tissues are normal. Sinuses/Orbits: No fluid levels or advanced mucosal thickening. No mastoid effusion. Normal orbits. MRA HEAD FINDINGS Intracranial internal carotid arteries: Normal. Anterior cerebral arteries: Normal. Middle cerebral arteries: Normal. Posterior communicating arteries: Absent bilaterally. Posterior cerebral arteries: Normal. Basilar artery: Normal. Vertebral arteries: Left dominant. Normal. Superior cerebellar arteries: Normal. Anterior inferior cerebellar arteries: Normal. Posterior inferior cerebellar arteries: Normal left. Not clearly seen on the right. MRA NECK FINDINGS Aortic arch: Normal 3 vessel aortic branching pattern. The visualized subclavian arteries are normal. Right carotid system: Normal course and caliber without stenosis or evidence of dissection. Left carotid system: Normal course and caliber without stenosis or evidence of dissection. Vertebral arteries: Codominant. Vertebral artery origins are normal. Vertebral arteries are normal in course and caliber to the vertebrobasilar confluence without stenosis or evidence of dissection. IMPRESSION: 1. Small focus of acute ischemia at the left frontoparietal junction, at the base of the central sulcus. No associated hemorrhage or mass effect. 2. Diffuse confluent white matter disease. 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 Severe chronic demyelinating disease could have this  appearance, but the features are not particularly suggestive of this and there is no abnormal contrast enhancement. 3. No emergent large vessel occlusion or high-grade intracranial or cervical arterial stenosis. Electronically Signed   By: Ulyses Jarred M.D.   On: 09/12/2017 18:20    Echocardiogram:                                               Study Conclusions  - Left ventricle: Abnormal septal motion. The cavity size was   normal. Systolic function was normal. The estimated ejection   fraction was 55%. Wall motion was normal; there were no regional   wall motion abnormalities. Left ventricular diastolic function   parameters were normal. - Atrial septum: A patent foramen ovale cannot be excluded. - Pericardium, extracardiac: A trivial pericardial effusion was   identified.     IMPRESSION: Ms. Heather Robbins is a 72 y.o. female with no significant PMH who presented to the ED with complaints of acute onset right sided weakness and ataxia. MRI reveals:  Small focus of acute ischemia at the left frontoparietal junction, at the base of the central sulcus.  Suspected Etiology: small vessel vs cardioembolic Resultant Symptoms: Right sided weakness and ataxia Stroke Risk Factors: hyperlipidemia and hypertension Other Stroke Risk Factors: Advanced age  Outstanding Stroke Work-up Studies:    Work up completed for now  09/13/2017 ASSESSMENT:   Neuro exam stable. Continues to have some mild Right sided weakness. Patient admits to infrequent medical care. ECHO  unable to rule out PFO. 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 Long term BP goal normotensive. May slowly restart home B/P medications after 48 hours, if necessary Home Meds: NONE  HYPERLIPIDEMIA:    Component Value Date/Time   CHOL 260 (H) 09/13/2017 0056   TRIG 82 09/13/2017 0056   HDL 46 09/13/2017 0056   CHOLHDL 5.7 09/13/2017 0056   VLDL 16 09/13/2017 0056   LDLCALC 198 (H) 09/13/2017 0056  Home Meds:  NONE LDL  goal < 70 Started on Lipitor to 80 mg daily Continue statin at discharge  PRE- DIABETES: Lab Results  Component Value Date   HGBA1C 5.8 (H) 09/13/2017   No results for input(s): GLUCAP in the last 168 hours. HgbA1c goal < 7.0 Continue CBG monitoring and SSI to maintain glucose 140-180 mg/dl DM education   Other Active Problems: Active Problems:   Stroke [facility])    Hospital day # 1 VTE prophylaxis: Lovenox Diet : Fall precautions Fall precautions Diet Heart Room service appropriate? Yes; Fluid consistency: Thin   FAMILY UPDATES: family at bedside  TEAM UPDATES: Zenia Resides, MD   Prior Home Stroke Medications:  No antithrombotic  Discharge Stroke Meds:  Please discharge patient on aspirin 325 mg daily   Disposition: Final discharge disposition not confirmed Therapy Recs:               PENDING Follow Up:  Follow-up Information    Dennie Bible, NP. Schedule an appointment as soon as possible for a visit in 6 week(s).   Specialty:  Family Medicine Contact information: [address] [place] [address] [phone]          Patient, No Pcp Per -PCP Follow up in 1-2 weeks   Case  Management aware of need   Assessment & plan discussed with with attending physician and they are in agreement.    Renie Ora Stroke Neurology Team 09/13/2017 1:34 PM  ATTENDING NOTE: I reviewed above note and agree with the assessment and plan. I have made any additions or clarifications directly to the above note. Pt was seen and examined.   72 year old female with no past medical history admitted for acute onset after right-sided weakness and right hand discoordination woke up in the morning.  CT showed left BG infarct.  MRI showed left CR/SO small infarct, and questionable right subacute periventricular punctate infarct.  Also remote left BG/thalamus lacunar infarcts.  MRA head and neck unremarkable.  2D echo EF 55%.  LDL 198 and A1c 5.8.  UDS negative.  Patient  stroke most consistent with small vessel disease, put on aspirin and Lipitor for stroke prevention.  Stroke risk factor  modification.  Due to bilateral involvement, recommend 30-day cardiac event monitor as outpatient to rule out A. Fib.  Neurology will sign off. Please  call with questions. Pt will follow up with Cecille Rubin, NP, at [facility] in about 6 weeks. Thanks for the consult.  Rosalin Hawking, MD PhD Stroke Neurology 09/13/2017 8:29 PM   To contact Stroke Continuity provider, please refer to [email or web address] After hours, contact General Neurology

## 2017-09-13 NOTE — Evaluation (Signed)
Physical Therapy Evaluation Patient Details Name: Heather Robbins MRN: [id number] DOB: Jan 03, 1946 Today's Date: 09/13/2017   History of Present Illness  72 y.o. female without any significant past medical history who presented to the ED with complaints of right sided weakness and ataxia. MRI reveals a small focus of acute ischemia at the left frontoparietal junction, at the base of the central sulcus.   Clinical Impression  PT eval complete. Pt is independent for bed mobility and transfers. Supervision provided for ambulation and stairs but no physical assist needed and no LOB noted. Pt with mild balance deficits, scoring 22/24 on DGI. Mild steppage gait noted RLE for toe clearance although MMT revealed 5/5 strength R ant tib. Pt is safe to return home independently. Driving recommendations per MD. Recommend OPPT to address gait and balance deficits. No further PT indicated in hospital setting. PT signing off.    Follow Up Recommendations Outpatient PT    Equipment Recommendations  None recommended by PT    Recommendations for Other Services       Precautions / Restrictions Precautions Precautions: None      Mobility  Bed Mobility Overal bed mobility: Independent                Transfers Overall transfer level: Independent Equipment used: None                Ambulation/Gait Ambulation/Gait assistance: Supervision Ambulation Distance (Feet): 250 Feet Assistive device: None Gait Pattern/deviations: Step-through pattern;Steppage   Gait velocity interpretation: at or above normal speed for age/gender General Gait Details: steppage gait noted RLE for toe clearance, although MMT revealed 5/5 strength R ant tib.  Stairs Stairs: Yes Stairs assistance: Supervision Stair Management: Two rails;Forwards;Alternating pattern Number of Stairs: 5    Wheelchair Mobility    Modified Rankin (Stroke Patients Only) Modified Rankin (Stroke Patients Only) Pre-Morbid Rankin  Score: No symptoms Modified Rankin: Slight disability     Balance Overall balance assessment: Modified Independent                               Standardized Balance Assessment Standardized Balance Assessment : Dynamic Gait Index   Dynamic Gait Index Level Surface: Normal Change in Gait Speed: Normal Gait with Horizontal Head Turns: Normal Gait with Vertical Head Turns: Normal Gait and Pivot Turn: Normal Step Over Obstacle: Mild Impairment Step Around Obstacles: Normal Steps: Mild Impairment Total Score: 22       Pertinent Vitals/Pain Pain Assessment: No/denies pain    Home Living Family/patient expects to be discharged to:: Private residence Living Arrangements: Alone Available Help at Discharge: Family;Available PRN/intermittently Type of Home: House Home Access: Level entry     Home Layout: One level Home Equipment: Cane - single point      Prior Function Level of Independence: Independent         Comments: Very active. Drives. Plays pickle ball daily.      Hand Dominance   Dominant Hand: Right    Extremity/Trunk Assessment   Upper Extremity Assessment Upper Extremity Assessment: Defer to OT evaluation    Lower Extremity Assessment Lower Extremity Assessment: RLE deficits/detail RLE Deficits / Details: 5/5 strength, c/o mild tingling in foot RLE Sensation: decreased proprioception    Cervical / Trunk Assessment Cervical / Trunk Assessment: Normal  Communication   Communication: No difficulties  Cognition Arousal/Alertness: Awake/alert Behavior During Therapy: WFL for tasks assessed/performed Overall Cognitive Status: Within Functional Limits for tasks assessed  General Comments      Exercises     Assessment/Plan    PT Assessment All further PT needs can be met in the next venue of care  PT Problem List Decreased mobility;Impaired sensation;Decreased balance        PT Treatment Interventions      PT Goals (Current goals can be found in the Care Plan section)  Acute Rehab PT Goals Patient Stated Goal: return to active lifestyle PT Goal Formulation: All assessment and education complete, DC therapy    Frequency     Barriers to discharge        Co-evaluation               AM-PAC PT "6 Clicks" Daily Activity  Outcome Measure Difficulty turning over in bed (including adjusting bedclothes, sheets and blankets)?: None Difficulty moving from lying on back to sitting on the side of the bed? : None Difficulty sitting down on and standing up from a chair with arms (e.g., wheelchair, bedside commode, etc,.)?: None Help needed moving to and from a bed to chair (including a wheelchair)?: None Help needed walking in hospital room?: None Help needed climbing 3-5 steps with a railing? : None 6 Click Score: 24    End of Session Equipment Utilized During Treatment: Gait belt Activity Tolerance: Patient tolerated treatment well Patient left: in bed;with call bell/phone within reach;with family/visitor present Nurse Communication: Mobility status PT Visit Diagnosis: Difficulty in walking, not elsewhere classified (R26.2)    Time: 1820-9906 PT Time Calculation (min) (ACUTE ONLY): 17 min   Charges:   PT Evaluation $PT Eval Low Complexity: 1 Low     PT G Codes:        Lorrin Goodell, PT  Office # [phone] Pager [phone]   Lorriane Shire 09/13/2017, 1:15 PM

## 2017-09-13 NOTE — Plan of Care (Signed)
  Progressing Education: Knowledge of General Education information will improve 09/13/2017 1629 - Progressing by Arantxa Piercey, Vertell Limber, RN Health Behavior/Discharge Planning: Ability to manage health-related needs will improve 09/13/2017 1629 - Progressing by Kaien Pezzullo, Vertell Limber, RN Clinical Measurements: Ability to maintain clinical measurements within normal limits will improve 09/13/2017 1629 - Progressing by Evalee Jefferson, RN Will remain free from infection 09/13/2017 1629 - Progressing by Evalee Jefferson, RN Diagnostic test results will improve 09/13/2017 1629 - Progressing by Evalee Jefferson, RN Respiratory complications will improve 09/13/2017 1629 - Progressing by Evalee Jefferson, RN Cardiovascular complication will be avoided 09/13/2017 1629 - Progressing by Evalee Jefferson, RN Activity: Risk for activity intolerance will decrease 09/13/2017 1629 - Progressing by Evalee Jefferson, RN Nutrition: Adequate nutrition will be maintained 09/13/2017 1629 - Progressing by Evalee Jefferson, RN Coping: Level of anxiety will decrease 09/13/2017 1629 - Progressing by Evalee Jefferson, RN Elimination: Will not experience complications related to bowel motility 09/13/2017 1629 - Progressing by Evalee Jefferson, RN Will not experience complications related to urinary retention 09/13/2017 1629 - Progressing by Evalee Jefferson, RN Pain Managment: General experience of comfort will improve 09/13/2017 1629 - Progressing by Evalee Jefferson, RN Safety: Ability to remain free from injury will improve 09/13/2017 1629 - Progressing by Evalee Jefferson, RN Skin Integrity: Risk for impaired skin integrity will decrease 09/13/2017 1629 - Progressing by Evalee Jefferson, RN Education: Knowledge of disease or condition will improve 09/13/2017 1629 - Progressing by Evalee Jefferson, RN Knowledge of secondary  prevention will improve 09/13/2017 1629 - Progressing by Evalee Jefferson, RN Knowledge of patient specific risk factors addressed and post discharge goals established will improve 09/13/2017 1629 - Progressing by Evalee Jefferson, RN Coping: Will verbalize positive feelings about self 09/13/2017 1629 - Progressing by Evalee Jefferson, RN Will identify appropriate support needs 09/13/2017 1629 - Progressing by Corde Antonini, Vertell Limber, RN Health Behavior/Discharge Planning: Ability to manage health-related needs will improve 09/13/2017 1629 - Progressing by Evalee Jefferson, RN Self-Care: Ability to participate in self-care as condition permits will improve 09/13/2017 1629 - Progressing by Vilma Will, Vertell Limber, RN Verbalization of feelings and concerns over difficulty with self-care will improve 09/13/2017 1629 - Progressing by Evalee Jefferson, RN Nutrition: Risk of aspiration will decrease 09/13/2017 1629 - Progressing by Evalee Jefferson, RN

## 2017-09-13 NOTE — Progress Notes (Signed)
Discharge instructions, RX's and follow up appt explained and provided to patient and family verbalized understanding. Patient left floor via wheelchair accompanied by staff.  Jamae Tison, Tivis Ringer, RN

## 2017-09-13 NOTE — Progress Notes (Signed)
Family Medicine Teaching Service Daily Progress Note Intern Pager: [phone]  Patient name: Heather Robbins Medical record number: [id number] Date of birth: 06-07-1946 Age: 72 y.o. Gender: female  Primary Care Provider: Patient, No Pcp Per Consultants: Neurology Code Status: Partial  Pt Overview and Major Events to Date:  Heather Robbins is a 72 y.o. female presenting with right-sided weakness and difficulty walking. No significant PMH.  Assessment and Plan:  Acute CVA of left basal ganglia. CT head significant for for acute infarct of left basal ganglia without hemorrhage. Head and Neck MRI showed small focus of acute infarct at left frontoparietal . Presented with minimal R sided deficits with 4.5/5 strength in R UE and LE as well R sided finger-to-nose and heel-to-shin. This morning she still has right arm heaviness and unsteadiness but almost back to baseline strength. TSH normal at 2.553, Lipid panel significant for cholesterol of 260 and LDL of 198. HgbA1c of 5.8 -Cont cardiac monitoring -Echocardiogram pending -Cont ASA81 and atorvastatin 40mg  qd  -Neurology following, appreciate recommendations -Permissive hypertension for 48 hours, then resume normotensive goal -PT/OT/SLP  Abnormal EKG: No known previous EKG with which to compare. EKG significant for nonspecific ST changes in II, III, aVF and possibly in V4-6. Patient asymptomatic this am.  I-STAT troponin was negative. CK normal at 80. Troponin x 1 is negative. Repeat EKG this am NSR with possible left atrial enlargement.  - No further workup necessary at this time - Cont to monitor for symptoms  HTN: Likely chronic. SBP range from 154-198 and DBP 71-93 in last 24hrs. Has not been previously diagnosed and has not been seen by a PCP in over a year. - Allowing permissive HTN for stroke thourgh 2/15 but resume normotensive goal on 2/16. - If dc'd before 2/16 have outpatient start BP med.  FEN/GI: Heart healthy diet PPx:  Lovenox  Disposition: med-surg  Subjective:  Patient states she feels well overall and has no complaints. She is somewhat shocked to hear the news that she had a stroke but took the news well. She denies headache, confusion, LOB, dizziness, or weakness. She does have residual right arm "heaviness" and difficulty with coordination. She is eating well with no difficulty with urination and normal stools.  She denies SOB, chest pain, abdominal pain, has some nausea, but no vomiting.  Objective: Temp:  [98.2 F (36.8 C)-98.7 F (37.1 C)] 98.2 F (36.8 C) (02/15 0000) Pulse Rate:  [64-91] 67 (02/14 1839) Resp:  [14-23] 16 (02/15 0000) BP: (154-195)/(71-93) 179/71 (02/15 0000) SpO2:  [96 %-100 %] 97 % (02/15 0000) Physical Exam:  Gen: Alert and Oriented x 3, NAD HEENT: Normocephalic, atraumatic, PERRLA, EOMI CV: RRR, no murmurs, normal S1, S2 split, +2 pulses dorsalis pedis bilaterally Resp: CTAB, no wheezing, rales, or rhonchi, comfortable work of breathing Abd: non-distended, non-tender, soft, +bs in all four quadrants MSK: FROM in all four extremities; grip strength 5/5 bilaterally in UE and LE Ext: no clubbing, cyanosis, or edema Neuro: CN II-XII intact, no focal or gross deficits Skin: warm, dry, intact, no rashes  Laboratory: Recent Labs  Lab 09/12/17 1233 09/12/17 1257 09/13/17 0730  WBC 6.7  --  5.1  HGB 13.2 13.9 12.4  HCT 41.9 41.0 39.8  PLT 247  --  223   Recent Labs  Lab 09/12/17 1233 09/12/17 1257 09/13/17 0730  NA 140 142 142  K 3.9 3.9 3.8  CL 107 106 103  CO2 22  --  27  BUN 9 11 9   CREATININE  0.77 0.60 0.79  CALCIUM 9.5  --  9.3  PROT 8.0  --   --   BILITOT 0.7  --   --   ALKPHOS 78  --   --   ALT 17  --   --   AST 25  --   --   GLUCOSE 112* 108* 95   2/14 - Troponin I: <0.03 2/14 - CK: 80 2/14 - Lipid: Tot. Chol. 260, Tri. 82, HDL 46, LDL 198 2/14 - UDS: none 2/14 - TSH: 2.553 2/14 - HgbA1c: 5.8  Imaging/Diagnostic Tests:  2/15 - CT Head  w/o Contrast: IMPRESSION: 1. Findings concerning for acute infarct in the left basal ganglia.  2/15 - MRA of Head and Neck:  IMPRESSION: 1. Small focus of acute ischemia at the left frontoparietal junction, at the base of the central sulcus. No associated hemorrhage or mass effect. 2. Diffuse confluent white matter disease. 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 Severe chronic demyelinating disease could have this appearance, but the features are not particularly suggestive of this and there is no abnormal contrast enhancement. 3. No emergent large vessel occlusion or high-grade intracranial or cervical arterial stenosis.  2/15 - MRI Cervical Spine: IMPRESSION: 1. No focal spinal cord lesion or evidence of demyelination. 2. 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  Nuala Alpha, DO 09/13/2017, 7:32 AM PGY-1, [place] Intern pager: [phone], text pages welcome

## 2017-09-13 NOTE — Care Management Note (Signed)
Case Management Note  Patient Details  Name: Tamee Battin MRN: [id number] Date of Birth: [birth year]  Subjective/Objective:      Pt admitted with CVA. She is from home alone.              Action/Plan: CM consulted for PCP and outpatient therapy. CM met with the patient and she would like to attend [facility] for therapy. Orders in Epic and information on the AVS. CM noted that [facility] practice is going to follow patient as PCP.  No further needs per CM.   Expected Discharge Date:                  Expected Discharge Plan:  OP Rehab  In-House Referral:     Discharge planning Services  CM Consult  Post Acute Care Choice:    Choice offered to:     DME Arranged:    DME Agency:     HH Arranged:    [place] Agency:     Status of Service:  Completed, signed off  If discussed at [employer] of Stay Meetings, dates discussed:    Additional Comments:  Pollie Friar, RN 09/13/2017, 4:31 PM

## 2017-09-13 NOTE — Care Management CC44 (Signed)
Condition Code 44 Documentation Completed  Patient Details  Name: Heather Robbins MRN: [id number] Date of Birth: September 01, 1945   Condition Code 44 given:  Yes Patient signature on Condition Code 44 notice:  Yes Documentation of 2 MD's agreement:  Yes Code 44 added to claim:  Yes    Pollie Friar, RN 09/13/2017, 5:05 PM

## 2017-09-18 ENCOUNTER — Telehealth: Payer: Self-pay

## 2017-09-18 NOTE — Telephone Encounter (Signed)
Patient left a message on nurse line stating she has an appt tomorrow with Dr Avon Gully. She wants to ask questions regarding her medications however since she has only been seen in the hospital I can't see her full record. Can you please call the patient?

## 2017-09-18 NOTE — Telephone Encounter (Signed)
Called patient. Patient concerned about lipitor possibly causing loose bowel movements. Discussed with patient that as I have never seen her before, it would be best for [place] to discuss any non-urgent concerns at her appointment tomorrow so we can fully delve into the situation and be sure to come up with a plan providing the best care for her. Discussed that patient can hold lipitor until appt tomorrow if she is very concerned. Patient voiced understanding.   Adin Hector, MD, MPH PGY-3 [facility] Pager [phone]

## 2017-09-19 ENCOUNTER — Encounter: Payer: Self-pay | Admitting: Internal Medicine

## 2017-09-19 ENCOUNTER — Ambulatory Visit: Payer: Medicare Other | Admitting: Internal Medicine

## 2017-09-19 DIAGNOSIS — I63313 Cerebral infarction due to thrombosis of bilateral middle cerebral arteries: Secondary | ICD-10-CM

## 2017-09-19 NOTE — Progress Notes (Signed)
   Subjective:   Patient: Heather Robbins       Birthdate: 28-Sep-1945       MRN: [id number]      HPI  Selene Haupt is a 72 y.o. female presenting for hospital f/u after admission for CVA.   Patient admitted from 2/14-2/14 for L CVA of frontoparietal region. Presented with R-sided weakness and foot drag. CT head showed acute infarct which MRI showed as well. Neuro was consulted and started aspirin and Lipitor. Also recommended 30d event monitor which was supposed to be set up with cardiology for patient to receive this week. Patient also with elevated BP throughout admission that was not treated to allow for permissive HTN.   Since discharge, patient says she has felt very well. She endorses continued foot drag, but this has not worsened. She denies any weakness. She has been doing home strengthening exercises provided to her by PT in hospital while she awakes outpatient PT/OT appointments. She did not receive event monitor this week as she says she was never contacted by cardiology. Also does not have neuro appt scheduled as she says she has not been contacted by them either. Had a few days of loose bowel movements and was concerned that may be due to Lipitor. This stopped yesterday and she has had normal bowel movements since. Has been taking lipitor and aspirin as prescribed.   Smoking status reviewed. Patient is never smoker.   Review of Systems See HPI.     Objective:  Physical Exam  Constitutional: She is oriented to person, place, and time and well-developed, well-nourished, and in no distress.  HENT:  Head: Normocephalic and atraumatic.  Cardiovascular: Normal rate, regular rhythm and normal heart sounds.  No murmur heard. Pulmonary/Chest: Effort normal and breath sounds normal. No respiratory distress. She has no wheezes.  Neurological: She is alert and oriented to person, place, and time.  5/5 strength upper and lower extremities bilaterally. Full grip strength bilaterally. Slight foot  drag on R when walking, though patient able to walk, sit, and stand without difficulty.   Psychiatric: Affect and judgment normal.      Assessment & Plan:  Stroke (cerebrum) [facility]) Doing well since hospital discharge. Does not yet have appts with neuro, PT or OT, and has not received event monitor yet. Checked with Leory Plowman who confirmed that PT/OT and neuro referrals have been placed; she will contact them today to figure out why patient has been contacted. Will also review hospital notes to find out who is most appropriate for her to contact regarding event monitor. Encouraged patient to call us if she has not been contacted regarding any or all of these referrals by next week. In the meantime, patient to continue home exercises, and to continue aspirin and Lipitor. Will f/u in 3 months or sooner if needed.    Adin Hector, MD, MPH PGY-3 [facility] Pager [phone]

## 2017-09-19 NOTE — Patient Instructions (Signed)
It was nice meeting you today Ms. Heather Robbins!  You should be hearing from physical therapy, occupational therapy, neurology, and cardiology regarding the event monitor soon. If you have not been contacted by any or all of these specialists within the next week, please call to let us know.   Please continue taking aspirin and Lipitor as you have been.   We will see you back in about three months to see how you are doing.   If you have any questions or concerns, please feel free to call the clinic.   Be well,  Dr. Avon Gully

## 2017-09-19 NOTE — Assessment & Plan Note (Signed)
Doing well since hospital discharge. Does not yet have appts with neuro, PT or OT, and has not received event monitor yet. Checked with Leory Plowman who confirmed that PT/OT and neuro referrals have been placed; she will contact them today to figure out why patient has been contacted. Will also review hospital notes to find out who is most appropriate for her to contact regarding event monitor. Encouraged patient to call us if she has not been contacted regarding any or all of these referrals by next week. In the meantime, patient to continue home exercises, and to continue aspirin and Lipitor. Will f/u in 3 months or sooner if needed.

## 2017-10-01 ENCOUNTER — Encounter: Payer: Self-pay | Admitting: Physical Therapy

## 2017-10-01 ENCOUNTER — Ambulatory Visit: Payer: Medicare Other | Attending: Neurology | Admitting: *Deleted

## 2017-10-01 ENCOUNTER — Ambulatory Visit: Payer: Medicare Other | Admitting: Physical Therapy

## 2017-10-01 ENCOUNTER — Encounter: Payer: Self-pay | Admitting: *Deleted

## 2017-10-01 ENCOUNTER — Other Ambulatory Visit: Payer: Self-pay

## 2017-10-01 DIAGNOSIS — R2689 Other abnormalities of gait and mobility: Secondary | ICD-10-CM | POA: Insufficient documentation

## 2017-10-01 DIAGNOSIS — R2681 Unsteadiness on feet: Secondary | ICD-10-CM | POA: Diagnosis present

## 2017-10-01 DIAGNOSIS — R278 Other lack of coordination: Secondary | ICD-10-CM | POA: Diagnosis present

## 2017-10-01 DIAGNOSIS — M6281 Muscle weakness (generalized): Secondary | ICD-10-CM | POA: Diagnosis present

## 2017-10-01 NOTE — Therapy (Signed)
[facility] [address] [place], [place], [address] Phone: [phone]   Fax:  [phone]  Occupational Therapy Evaluation  Patient Details  Name: Heather Robbins MRN: [id number] Date of Birth: 11-29-1945 Referring Provider: Dr Rosalin Hawking   Encounter Date: 10/01/2017  OT End of Session - 10/01/17 1017    Visit Number  1    Number of Visits  1    Authorization Type  Eval only    OT Start Time  0933    OT Stop Time  1010    OT Time Calculation (min)  37 min    Activity Tolerance  Patient tolerated treatment well    Behavior During Therapy  [facility] for tasks assessed/performed       History reviewed. No pertinent past medical history.  Past Surgical History:  Procedure Laterality Date  . CESAREAN SECTION      There were no vitals filed for this visit.  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 She presented with R sided weakness and was d/c home.    Pertinent History  Pt is currently Driving. Pt lives in [place] ~45 min away.    Currently in Pain?  No/denies    Pain Score  0-No pain    Multiple Pain Sites  No        [facility] OT Assessment - 10/01/17 0940      Assessment   Medical Diagnosis  Lt CVA    Referring Provider  Dr Rosalin Hawking    Onset Date/Surgical Date  09/12/17    Hand Dominance  Right Ambidextrous    Next MD Visit  November 04, 2017      Precautions   Precautions  None      Restrictions   Weight Bearing Restrictions  No      Balance Screen   Has the patient fallen in the past 6 months  No    Has the patient had a decrease in activity level because of a fear of falling?   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       IADL   Shopping  Takes care of all shopping needs independently    Light Housekeeping  Does personal laundry completely    Meal Prep  Plans, prepares and serves adequate meals independently    Investment banker, corporate own vehicle    Medication Management  Is responsible for taking medication in correct dosages at correct time    Psychiatrist financial matters independently (budgets, writes checks, pays rent, bills goes to bank), collects and keeps track of income      Mobility   Mobility Status  Independent Independent without AD      Written Expression   Dominant Hand  Right Ambidextrous - often uses left hand functionally    Handwriting  Increased time    Written Experience  Within Functional Limits      Vision - History   Baseline Vision  No visual deficits      Vision Assessment   Eye Alignment  Within Functional  Limits      Activity Tolerance   Activity Tolerance Comments  Decreased activity tolerance for leisure activities only. Pt reports idependence with ADL's and IADL's except playing pickleball. Note: Pt plays pickleball with Left hand (prior to CVA) "I do more with my left hand than the right" per pt report      Cognition   Overall Cognitive Status  Within Functional Limits for tasks assessed    Memory  Appears intact    Awareness  Appears intact    Problem Solving  Appears intact      Sensation   Light Touch  Appears Intact    Additional Comments  Intact to light touch bilaterally      Coordination   Gross Motor Movements are Fluid and Coordinated  Yes    Fine Motor Movements are Fluid and Coordinated  Yes WFL's for tasks, R CMC arthritis noted    9 Hole Peg Test  Right;Left    Right 9 Hole Peg Test  24.07 seconds    Left 9 Hole Peg Test  17.16 seconds    Coordination  able to oppose digits forward and backward (fast/slow) w/o difficulty noted. Overall WFL's for coordination bilaterally.       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 She also states that she feels stronger in left hand "I have always been stronger in the left hand b/c of the arthritis"          Evaluation findings and recommendations reviewed following assessment in clinic today. Discussed that pt may benefit from gentle strengthening and FM/ccordination however, she reports that  she does most functional activity with left, as her right hand has longstanding h/o CMC arthritis that flares with resistive activity,  gripping and increased functional use. Pt was educated verbbally in gentle AROM ex's and FM control activities w/o resistance and plans to do this on her own. Pt prefers Eval only for OT and will f/u with PT for LE concerns at this time.   Discussed FM/coordination activities with R hand using coins, stacking, placing one at a time into container, handwriting activities, use of tweezers to pick up small small objects, use of right hand to pick up beans, marbles, coins into container, shuffling & dealing cards and use of Right hand for all ADL's (Pt currently reports I with rollers and doing her hair, buttoning buttons, tying shoes etc). Also discussed avoidance of grip/graps activities that cause increased symptoms in R [facility] joint arthritis - she verbalized understanding of this.           OT Education - 10/01/17 1015    Education provided  Yes    Education Details  Evaluation findings and recommendations reviewed. Pt may benefit from gentle strengthening and FM/ccordination however, she does most functional activity with left, as her right hand has CMC arthritis that flares with resistive activity, gripping and increased functional use. Pt was educated verbbally in gentle AROM ex's and FM control activities w/o resistance and plans to do this on her own. Eval only for OT.     Person(s) Educated  Patient    Methods  Explanation;Demonstration;Verbal cues    Comprehension  Verbalized understanding;Returned demonstration       OT Short Term Goals - 10/01/17 1214      OT SHORT TERM GOAL #1   Title  Eval only        OT Long Term Goals - 10/01/17 1215      OT LONG TERM GOAL #1   Title  Eval only            Plan - 10/01/17 1203    Clinical Impression Statement  Pt is a pleasant 72 y/o female s/p L CVA of frontoparietal region with slight deficits in coordination/strength in her R UE (grossly 4+/5). Pt also has h/o of R CMC joint arthritis that flares with increased grip/grasp and  coordination activities and reports that she has always been ambidextrous. She is overall Mod I all ADL's, cognition is WFL's, as is strength & coordination, she is currently driving. Discussed out-pt OT for possible gentle strengthening, coordination R UE with pt however due to her h/o [facility] joint arthritis and her current level of I with all ADL's, she polietly declined. She was verbally educated in gentle AROM R UE and FM/coordination ex's taking care to avoid incereased pain/symptoms to her R [facility] joint to which she verbalized understanding. Evaluation/recommendations only, will sign off out-pt OT at this time.    Occupational Profile and client history currently impacting functional performance  R hand CMC joint Arthritis; Coordination and strength R UE WFL's    Occupational performance deficits (Please refer to evaluation for details):  Leisure Pt reports that she has not returned to playing pickleball due to RLE weakness and prefers to f/u with PT only. Note: Pt has always used her left hand when playing pickleball.    OT Frequency  One time visit    OT Duration  Other (comment) Eval only    Plan  Evaluation only, will sign off out-pt OT at this time as pt wishes to pursue out-pt  PT only.    Clinical Decision Making  Limited treatment options, no task modification necessary    OT Home Exercise Plan  Pt to independently cont gentle AROM, funcitonal use RUE, coordination/FM ex's as outlined verbally in clinic today.    Recommended Other Services  PT    Consulted and Agree with Plan of Care  Patient       Patient will benefit from skilled therapeutic intervention in order to improve the following deficits and impairments:     Visit Diagnosis: Other lack of coordination - Plan: Ot plan of care cert/re-cert  Muscle weakness (generalized) - Plan: Ot plan of care cert/re-cert    Problem List Patient Active Problem List   Diagnosis Date Noted  . Stroke (cerebrum) ([place]) 09/13/2017  . Stroke  ([place]) 09/12/2017  . Family history of MI (myocardial infarction)   . Family history of chronic renal disease   . ST segment depression     Almyra Deforest, OTR/L 10/01/2017, 12:17 PM  [facility] [address] [place], [place], [address] Phone: [phone]   Fax:  [phone]  Name: Heather Robbins MRN: [id number] Date of Birth: 17-Aug-1945

## 2017-10-01 NOTE — Patient Instructions (Signed)
Evaluation findings and recommendations reviewed following assessment in clinic today. Discussed that pt may benefit from gentle strengthening and FM/ccordination however, she reports that she does most functional activity with left, as her right hand has longstanding h/o CMC arthritis that flares with resistive activity, gripping and increased functional use. Pt was educated verbbally in gentle AROM ex's and FM control activities w/o resistance and plans to do this on her own. Pt prefers Eval only for OT and will f/u with PT for LE concerns at this time.   Discussed FM/coordination activities with R hand using coins, stacking, placing one at a time into container, handwriting activities, use of tweezers to pick up small small objects, use of right hand to pick up beans, marbles, coins into container, shuffling & dealing cards and use of Right hand for all ADL's (Pt currently reports I with rollers and doing her hair, buttoning buttons, tying shoes etc). Also discussed avoidance of grip/graps activities that cause increased symptoms in R [facility] joint arthritis - she verbalized understanding of this.

## 2017-10-01 NOTE — Patient Instructions (Addendum)
Heel Raise: Unilateral (Standing)    Balance on left foot, then rise on ball of foot. Repeat _10___ times per set. Do _3___ sets per session. Do __1_ sessions per day.  HOLD 3 SECS  http://orth.exer.us/41   Copyright  VHI. All rights reserved.  SINGLE LIMB STANCE    Stance: single leg on floor. Raise leg. Hold _10-15__ seconds. Repeat with other leg. _1-2__ reps per set, _2__ sets per day, _5__ days per week  Copyright  VHI. All rights reserved.

## 2017-10-02 NOTE — Therapy (Addendum)
[facility] [address] [place], [place], [address] Phone: [phone]   Fax:  [phone]  Physical Therapy Evaluation  Patient Details  Name: Heather Robbins MRN: [id number] Date of Birth: 1946-04-13 Referring Provider: Dr. Rosalin Hawking   Encounter Date: 10/01/2017  PT End of Session - 10/02/17 2138    Visit Number  1    Number of Visits  5    Date for PT Re-Evaluation  11/01/17    Authorization Type  UHC Medicare    Authorization Time Period  10-01-17 - 11-29-17    PT Start Time  0847    PT Stop Time  0932    PT Time Calculation (min)  45 min       History reviewed. No pertinent past medical history.  Past Surgical History:  Procedure Laterality Date  . CESAREAN SECTION      There were no vitals filed for this visit.   Subjective Assessment - 10/03/17 2031    Subjective  Pt states she woke up with Rt sided weakness on 09-12-17; pt went to [facility] ED on 09-12-17 and was admitted, then discharged on 09-13-17; pt reports RLE heaviness and weakness, but states she is doing much better    Patient Stated Goals  "want to be able to play pickleball"         [facility] PT Assessment - 10/03/17 0001      Assessment   Medical Diagnosis  Lt CVA    Referring Provider  Dr. Rosalin Hawking    Onset Date/Surgical Date  09/12/17    Hand Dominance  Right ambidextrous    Next MD Visit  November 04, 2017      Precautions   Precautions  None      Restrictions   Weight Bearing Restrictions  No      Balance Screen   Has the patient fallen in the past 6 months  No    Has the patient had a decrease in activity level because of a fear of falling?   No    Is the patient reluctant to leave their home because of a fear of falling?   No      Home Environment   Living Environment  Private residence    Type of [place] Access  Level entry    [place]  One level      Prior Function   Level of [place]   Retired plays pickleball, dances    Leisure  [place] - not currently playing since decreased balance with RLE      Sensation   Light Touch  Appears Intact      ROM / Strength   AROM / PROM / Strength  AROM;Strength      Strength   Strength Assessment Site  Knee;Hip;Ankle    Right/Left Hip  Right    Right Hip Flexion  4/5    Right Hip Extension  3/5    Right Hip ABduction  3+/5    Right/Left Knee  Right    Right Knee Flexion  3+/5    Right Knee Extension  5/5    Right/Left Ankle  Right    Right Ankle Dorsiflexion  4/5    Right Ankle Plantar Flexion  4/5      Ambulation/Gait   Ambulation/Gait  Yes    Ambulation/Gait Assistance  6: Modified independent (Device/Increase time)    Ambulation Distance (Feet)  200 Feet    Assistive device  None    Gait Pattern  Step-through pattern    Ambulation Surface  Level;Indoor    Gait velocity  8.25 = 3.98 ft/sec    Stairs  Yes    Stairs Assistance  6: Modified independent (Device/Increase time)    Stair Management Technique  One rail Right;Alternating pattern;Forwards    Number of Stairs  4    Height of Stairs  6      Standardized Balance Assessment   Standardized Balance Assessment  Timed Up and Go Test      Timed Up and Go Test   Normal TUG (seconds)  9.31             Objective measurements completed on examination: See above findings.              PT Education - 10/03/17 2040    Education provided  Yes    Education Details  HEP for balance and RLE strengthening    Person(s) Educated  Patient    Methods  Explanation;Demonstration;Handout    Comprehension  Verbalized understanding;Returned demonstration          PT Long Term Goals - 10/03/17 2048      PT LONG TERM GOAL #1   Title  Pt will negotiate 4 steps without rail using a step over step sequence.    Time  4    Period  Weeks    Status  New    Target Date  10/31/17      PT LONG TERM GOAL #2   Title  Pt will perform Rt SLS at least 10 secs to  enable return to recreational activities.    Time  4    Period  Weeks    Status  New    Target Date  10/31/17      PT LONG TERM GOAL #3   Title  Independent in HEP for RLE strengthening and balance.    Time  4    Period  Weeks    Status  New    Target Date  10/31/17      PT LONG TERM GOAL #4   Title  Pt will report returning to play pickleball for recreational activity.     Time  4    Period  Weeks    Status  New    Target Date  10/31/17             Plan - 10/03/17 2042    Clinical Impression Statement  Pt is a 72 yr old lady with mild RLE weakness and decreased balance due to Lt frontoparietal CVA on 09-12-17.  Pt was hospitalized 2-14 - 09-13-17 at [facility].  Pt's gait is functional with mild deviations; pt is not at fall risk per TUG score of 9.31 secs and is functional ambulator with gait velocity 3.98 ft/sec without use of device.      History and Personal Factors relevant to plan of care:  pt wants to return to playing pickleball; pt had active lifestyle    Clinical Presentation  Stable    Clinical Presentation due to:  Lt CVA    Clinical Decision Making  Low    Rehab Potential  Good    PT Frequency  1x / week    PT Duration  4 weeks    PT Treatment/Interventions  ADLs/Self Care Home Management;Stair training;Gait training;Therapeutic activities;Therapeutic exercise;Balance training;Neuromuscular re-education;Patient/family education    PT Next Visit Plan  check HEP given on 10-01-17:  cont with high level balance and gait - Rt SLS activities    PT Home Exercise Plan  Rt hip, knee flexor and PF strengthening    Recommended Other Services  pt evaluated by OT - service not needed    Consulted and Agree with Plan of Care  Patient       Patient will benefit from skilled therapeutic intervention in order to improve the following deficits and impairments:  Abnormal gait, Decreased balance, Decreased coordination, Decreased strength  Visit Diagnosis: Muscle  weakness (generalized) - Plan: PT plan of care cert/re-cert  Other abnormalities of gait and mobility - Plan: PT plan of care cert/re-cert  Unsteadiness on feet - Plan: PT plan of care cert/re-cert  Other lack of coordination - Plan: PT plan of care cert/re-cert     Problem List Patient Active Problem List   Diagnosis Date Noted  . Stroke (cerebrum) ([place]) 09/13/2017  . Stroke ([place]) 09/12/2017  . Family history of MI (myocardial infarction)   . Family history of chronic renal disease   . ST segment depression     Lacorey Brusca, [place], PT 10/03/2017, 8:55 PM  [facility] [address] [place], [place], [address] Phone: [phone]   Fax:  [phone]  Name: Heather Robbins MRN: [id number] Date of Birth: [birth year]

## 2017-10-03 NOTE — Addendum Note (Signed)
Addended by: Lamar Benes on: 10/03/2017 08:56 PM   Modules accepted: Orders

## 2017-10-07 ENCOUNTER — Ambulatory Visit: Payer: Medicare Other | Admitting: Physical Therapy

## 2017-10-07 DIAGNOSIS — R2681 Unsteadiness on feet: Secondary | ICD-10-CM

## 2017-10-07 DIAGNOSIS — R278 Other lack of coordination: Secondary | ICD-10-CM | POA: Diagnosis not present

## 2017-10-07 DIAGNOSIS — M6281 Muscle weakness (generalized): Secondary | ICD-10-CM

## 2017-10-08 ENCOUNTER — Encounter: Payer: Self-pay | Admitting: Physical Therapy

## 2017-10-08 NOTE — Therapy (Signed)
[facility] [address] [place], [place], [address] Phone: [phone]   Fax:  [phone]  Physical Therapy Treatment  Patient Details  Name: Heather Robbins MRN: [id number] Date of Birth: 04-21-1946 Referring Provider: Dr. Rosalin Hawking   Encounter Date: 10/07/2017  PT End of Session - 10/08/17 2207    Visit Number  2    Number of Visits  5    Date for PT Re-Evaluation  11/01/17    Authorization Type  UHC Medicare    Authorization Time Period  10-01-17 - 11-29-17    PT Start Time  1452    PT Stop Time  1536    PT Time Calculation (min)  44 min       History reviewed. No pertinent past medical history.  Past Surgical History:  Procedure Laterality Date  . CESAREAN SECTION      There were no vitals filed for this visit.  Subjective Assessment - 10/08/17 2157    Subjective  Pt reports no problems or changes since last visit; has been doing exercises as instructed    Pertinent History  CVA on 09-12-17    Patient Stated Goals  "want to be able to play pickleball"    Currently in Pain?  No/denies                      [facility] Adult PT Treatment/Exercise - 10/08/17 0001      Ambulation/Gait   Ambulation/Gait  Yes    Ambulation/Gait Assistance  6: Modified independent (Device/Increase time)    Ambulation Distance (Feet)  230 Feet    Assistive device  None    Gait Pattern  Step-through pattern    Ambulation Surface  Level;Indoor    Gait Comments  Pt amb. 17' tossing ball for dynamic gait training; amb. 50' backward tossing ball with CGA       Exercises   Exercises  Ankle      Ankle Exercises: Aerobic   Elliptical  level 1.5 x 1"       Ankle Exercises: Standing   Heel Raises  Both;10 reps;Right RLE only 10 reps          Balance Exercises - 10/08/17 2204      Balance Exercises: Standing   Tandem Stance  Eyes open;1 rep;30 secs    SLS  Eyes open;2 reps;10 secs    Sidestepping  1 rep    Other  Standing Exercises  Pt performed SLS activity - rolling ball up/back and laterally 10 reps each leg with UE support prn on // bar      Pt performed jumping, hopping, 1/2 jumping jacks, lunges and lateral shuffle in simulation of pickleball moves - no Significant LOB occurred       PT Long Term Goals - 10/03/17 2048      PT LONG TERM GOAL #1   Title  Pt will negotiate 4 steps without rail using a step over step sequence.    Time  4    Period  Weeks    Status  New    Target Date  10/31/17      PT LONG TERM GOAL #2   Title  Pt will perform Rt SLS at least 10 secs to enable return to recreational activities.    Time  4    Period  Weeks    Status  New    Target Date  10/31/17      PT LONG TERM GOAL #  3   Title  Independent in HEP for RLE strengthening and balance.    Time  4    Period  Weeks    Status  New    Target Date  10/31/17      PT LONG TERM GOAL #4   Title  Pt will report returning to play pickleball for recreational activity.     Time  4    Period  Weeks    Status  New    Target Date  10/31/17            Plan - 10/08/17 2208    Clinical Impression Statement  Pt is progressing well towards goals; no major LOB with dynamic standing or gait activities.  Pt able to jump, perform lateral shuffle in simulation of pickleball moves without LOB.    Rehab Potential  Good    PT Frequency  1x / week    PT Duration  4 weeks    PT Treatment/Interventions  ADLs/Self Care Home Management;Stair training;Gait training;Therapeutic activities;Therapeutic exercise;Balance training;Neuromuscular re-education;Patient/family education    PT Next Visit Plan  cont with high level balance and gait - Rt SLS activities    PT Home Exercise Plan  Rt hip, knee flexor and PF strengthening    Consulted and Agree with Plan of Care  Patient       Patient will benefit from skilled therapeutic intervention in order to improve the following deficits and impairments:  Abnormal gait, Decreased  balance, Decreased coordination, Decreased strength  Visit Diagnosis: Muscle weakness (generalized)  Unsteadiness on feet     Problem List Patient Active Problem List   Diagnosis Date Noted  . Stroke (cerebrum) ([place]) 09/13/2017  . Stroke ([place]) 09/12/2017  . Family history of MI (myocardial infarction)   . Family history of chronic renal disease   . ST segment depression     Treyvin Glidden, [place], PT 10/08/2017, 10:13 PM  [facility] [address] [place], [place], [address] Phone: [phone]   Fax:  [phone]  Name: Heather Robbins MRN: [id number] Date of Birth: 1946-05-08

## 2017-10-14 ENCOUNTER — Other Ambulatory Visit: Payer: Self-pay

## 2017-10-14 ENCOUNTER — Ambulatory Visit: Payer: Medicare Other | Admitting: Physical Therapy

## 2017-10-14 MED ORDER — ATORVASTATIN CALCIUM 80 MG PO TABS: 80.0000 mg | ORAL_TABLET | Freq: Every day | ORAL | 2 refills | Status: DC

## 2017-10-14 MED ORDER — ASPIRIN 81 MG PO TABS: 81.0000 mg | ORAL_TABLET | Freq: Every day | ORAL | 2 refills | Status: DC

## 2017-10-14 NOTE — Telephone Encounter (Signed)
Patient needs refills on Atorvastatin and Aspirin if she is still to take. Please send 30 day supply with the appropriate number of refills to [facility]. Patient to come back in May.  Danley Danker, RN [facility] RN)

## 2017-10-22 ENCOUNTER — Ambulatory Visit: Payer: Medicare Other | Admitting: Physical Therapy

## 2017-10-28 ENCOUNTER — Ambulatory Visit: Payer: Medicare Other | Admitting: Physical Therapy

## 2017-11-04 ENCOUNTER — Ambulatory Visit: Payer: Medicare Other | Admitting: Adult Health

## 2017-11-04 ENCOUNTER — Encounter (INDEPENDENT_AMBULATORY_CARE_PROVIDER_SITE_OTHER): Payer: Self-pay

## 2017-11-04 ENCOUNTER — Encounter: Payer: Self-pay | Admitting: Adult Health

## 2017-11-04 VITALS — BP 176/97 | HR 72 | Ht 65.0 in | Wt 112.2 lb

## 2017-11-04 DIAGNOSIS — I63512 Cerebral infarction due to unspecified occlusion or stenosis of left middle cerebral artery: Secondary | ICD-10-CM | POA: Diagnosis not present

## 2017-11-04 DIAGNOSIS — I10 Essential (primary) hypertension: Secondary | ICD-10-CM | POA: Diagnosis not present

## 2017-11-04 DIAGNOSIS — E785 Hyperlipidemia, unspecified: Secondary | ICD-10-CM

## 2017-11-04 MED ORDER — ATORVASTATIN CALCIUM 80 MG PO TABS: 80.0000 mg | ORAL_TABLET | Freq: Every day | ORAL | 2 refills | Status: DC

## 2017-11-04 MED ORDER — ASPIRIN 81 MG PO TABS: 81.0000 mg | ORAL_TABLET | Freq: Every day | ORAL | 2 refills | Status: DC

## 2017-11-04 NOTE — Progress Notes (Signed)
I have read the note, and I agree with the clinical assessment and plan.  Jillana Selph A. Creig Landin, MD, PhD, FAAN Certified in Neurology, Clinical Neurophysiology, Sleep Medicine, Pain Medicine and Neuroimaging  [facility] [address], [address] [place], [place] [address] [phone]  

## 2017-11-04 NOTE — Progress Notes (Signed)
[facility] [address] [place]. [place] [address] [phone]       OFFICE FOLLOW UP NOTE  Ms. Heather Robbins Date of Birth:  [birth year] Medical Record Number:  [id number]   Reason for Referral:  hospital stroke follow up  CHIEF COMPLAINT:  Chief Complaint  Patient presents with  . Follow-up    Stroke follow up, Pt saw Dr. Erlinda Hong in hospital    HPI: Heather Robbins is being seen today for initial visit in the office for stroke on 09/12/17. History obtained from patient and chart review. Reviewed all radiology images and labs personally.  Heather Robbins is a 72 year old female with a PMH of HLD and HTN who presented to the ED on 09/12/2017 with complaints of right-sided weakness and ataxia.  Patient went to bed at night previously in her usual state of health but woke up at 3 AM with some right arm and right leg weakness with unsteady gait.  Due to symptoms not resolving, she went to urgent care who instructed her to go to the ER.  Initial CT reviewed and concerning for acute infarct of the left basal ganglia.  MRI did show a small focus of acute ischemia in the left frontoparietal junction, at the base of the central sulcus and questionable right subacute periventricular punctate infarct.  MRA head/neck was negative for emergent large vessel occlusion or high-grade intracranial or cervical arterial stenosis.  2D echo showed EF of 55% and a PFO could not  be excluded.  LDL 198 and A1c 5.8.  Recommend the patient start aspirin 325 and Lipitor 80.  Per Dr. Phoebe Sharps note, it was unclear of etiology between SVD and cardioembolic.- recommended a 75-ZWC cardiac monitor to rule out A. fib and to follow-up with cardiology and have a repeat outpatient echo.  Patient discharged home in stable condition.   Since discharge, patient has been doing well.  She did have a couple sessions of PT/OT but patient canceled remaining appointments as she felt she was getting better and can continue to do exercises at  home.  Patient does continue to have intermittent right-sided foot drop but denies affecting ADLs.  She continues to take aspirin without side effects of bleeding or bruising.  Continues to take Lipitor with mild complaints of foot/leg cramps which have begun in the past few weeks (patient has been taking Lipitor for 2 months).  Blood pressure elevated at today's appointment at 178/88.  Patient does not check blood pressure at home but willing to start to do this.  She does exhibit symptoms of anxiety during appointment.  Denies snoring or increase in daytime fatigue.  Denies issues with memory.  Has not followed up with cardiologist regarding cardiac monitor or possible repeat of echo.  Patient has been keeping active and maintains eating a healthy diet.  Denies new or worsening stroke/TIA symptoms.   ROS:   14 system review of systems performed and negative with exception of cramps  PMH:  Past Medical History:  Diagnosis Date  . Stroke [facility])     PSH:  Past Surgical History:  Procedure Laterality Date  . CESAREAN SECTION      Social History:  Social History   Socioeconomic History  . Marital status: Widowed    Spouse name: Not on file  . Number of children: Not on file  . Years of education: Not on file  . Highest education level: Not on file  Occupational History  . Not on file  Social Needs  .  Financial resource strain: Not on file  . Food insecurity:    Worry: Not on file    Inability: Not on file  . Transportation needs:    Medical: Not on file    Non-medical: Not on file  Tobacco Use  . Smoking status: Never Smoker  . Smokeless tobacco: Never Used  Substance and Sexual Activity  . Alcohol use: Yes    Alcohol/week: 0.6 oz    Types: 1 Glasses of wine per week    Frequency: Never    Comment: occasionally  . Drug use: No  . Sexual activity: Never  Lifestyle  . Physical activity:    Days per week: Not on file    Minutes per session: Not on file  . Stress: Not on  file  Relationships  . Social connections:    Talks on phone: Not on file    Gets together: Not on file    Attends religious service: Not on file    Active member of club or organization: Not on file    Attends meetings of clubs or organizations: Not on file    Relationship status: Not on file  . Intimate partner violence:    Fear of current or ex partner: Not on file    Emotionally abused: Not on file    Physically abused: Not on file    Forced sexual activity: Not on file  Other Topics Concern  . Not on file  Social History Narrative  . Not on file    Family History:  Family History  Problem Relation Age of Onset  . Heart attack Mother   . Diabetes Mother   . Emphysema Father   . Asthma Father   . Heart attack Father   . Heart attack Maternal Grandmother     Medications:   No current outpatient medications on file prior to visit.   No current facility-administered medications on file prior to visit.     Allergies:  No Known Allergies   Physical Exam  Vitals:   11/04/17 1048  BP: (!) 176/97  Pulse: 72  Weight: 112 lb 3.2 oz (50.9 kg)  Height: 5\' 5"  (1.651 m)   Body mass index is 18.67 kg/m. No exam data present  General: well developed, well nourished, seated, in no evident distress Head: head normocephalic and atraumatic.   Neck: supple with no carotid or supraclavicular bruits Cardiovascular: regular rate and rhythm, no murmurs Musculoskeletal: no deformity Skin:  no rash/petichiae Vascular:  Normal pulses all extremities  Neurologic Exam Mental Status: Awake and fully alert. Oriented to place and time. Recent and remote memory intact. Attention span, concentration and fund of knowledge appropriate. Mood and affect appropriate.  Recall 2/3.  Serial additions satisfactory.  AFT 12.  Clock drawing 4/4. Cranial Nerves: Fundoscopic exam reveals sharp disc margins. Pupils equal, briskly reactive to light. Extraocular movements full without nystagmus.  Visual fields full to confrontation. Hearing intact. Facial sensation intact. Face, tongue, palate moves normally and symmetrically.  Motor: Normal bulk and tone. Normal strength in all tested extremity muscles. Sensory.: intact to touch , pinprick , position and vibratory sensation.  Coordination: Rapid alternating movements normal in all extremities. Finger-to-nose and heel-to-shin performed accurately bilaterally. Gait and Station: Arises from chair without difficulty. Stance is normal. Gait demonstrates normal stride length and balance . Able to heel, toe and tandem walk without difficulty.  Reflexes: 1+ and symmetric. Toes downgoing.  She does have intermittent  NIHSS  0  Modified Rankin  1  Diagnostic Data (Labs, Imaging, Testing)  CT head Result date: 09/12/2017 IMPRESSION: 1. Findings concerning for acute infarct in the left basal ganglia.  MR MRI head/neck Result date: 09/12/2017 IMPRESSION: 1. Small focus of acute ischemia at the left frontoparietal junction, at the base of the central sulcus. No associated hemorrhage or mass effect. 2. Diffuse confluent white matter disease. 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 Severe chronic demyelinating disease could have this appearance, but the features are not particularly suggestive of this and there is no abnormal contrast enhancement. 3. No emergent large vessel occlusion or high-grade intracranial or cervical arterial stenosis.  MRI cervical spine Result date: 09/12/2017 IMPRESSION: 1. No focal spinal cord lesion or evidence of demyelination. 2. 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 The cavity size was   normal. Systolic function was normal. The estimated ejection    fraction was 55%. Wall motion was normal; there were no regional   wall motion abnormalities. Left ventricular diastolic function   parameters were normal. - Atrial septum: A patent foramen ovale cannot be excluded. - Pericardium, extracardiac: A trivial pericardial effusion was   identified.   ASSESSMENT: Heather Robbins is a 72 y.o. 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 Vascular risk factors include HTN and HLD.     PLAN: -Continue aspirin 325 mg daily  and Lipitor for secondary stroke prevention -30-day cardiac monitor ordered -F/u with PCP regarding your HLD and HTN management -Recommend monitor BP at home -Schedule appointment with cardiologist for possible repeat of echo -Maintain strict control of hypertension with blood pressure goal below 130/90, diabetes with hemoglobin A1c goal below 6.5% and cholesterol with LDL cholesterol (bad cholesterol) goal below 70 mg/dL. I also advised the patient to eat a healthy diet with plenty of whole grains, cereals, fruits and vegetables, exercise regularly and maintain ideal body weight.  Follow up in 4 months or call earlier if needed   Greater than 50% time during this 25 minute consultation visit was spent on counseling and coordination of care about HLD, and HTN, discussion about risk benefit of anticoagulation and answering questions.    Venancio Poisson, AGNP-BC  [facility] [address] [place], [place] [address]  Phone [phone] Fax [phone]

## 2017-11-04 NOTE — Patient Instructions (Signed)
Continue aspirin 325 mg daily  and lipitor  for secondary stroke prevention  Follow up with PCP regarding cholesterol and blood pressure management  We will call you to schedule an appointment for the cardiac monitor  Obtain blood pressure cuff and check blood pressures at home  Continue home exercises from physical therapy  Maintain strict control of hypertension with blood pressure goal below 130/90, diabetes with hemoglobin A1c goal below 6.5% and cholesterol with LDL cholesterol (bad cholesterol) goal below 70 mg/dL. I also advised the patient to eat a healthy diet with plenty of whole grains, cereals, fruits and vegetables, exercise regularly and maintain ideal body weight.  Followup in the future with me in 4 months or call earlier if needed

## 2017-11-28 ENCOUNTER — Encounter: Payer: Self-pay | Admitting: Internal Medicine

## 2017-11-28 ENCOUNTER — Ambulatory Visit: Payer: Medicare Other | Admitting: Internal Medicine

## 2017-11-28 VITALS — BP 140/89 | HR 66 | Temp 98.3°F | Ht 65.0 in | Wt 111.2 lb

## 2017-11-28 DIAGNOSIS — I63512 Cerebral infarction due to unspecified occlusion or stenosis of left middle cerebral artery: Secondary | ICD-10-CM

## 2017-11-28 DIAGNOSIS — I639 Cerebral infarction, unspecified: Secondary | ICD-10-CM

## 2017-11-28 DIAGNOSIS — I63313 Cerebral infarction due to thrombosis of bilateral middle cerebral arteries: Secondary | ICD-10-CM

## 2017-11-28 MED ORDER — ASPIRIN 81 MG PO TABS: 81.0000 mg | ORAL_TABLET | Freq: Every day | ORAL | 3 refills | Status: DC

## 2017-11-28 MED ORDER — ATORVASTATIN CALCIUM 80 MG PO TABS: 80.0000 mg | ORAL_TABLET | Freq: Every day | ORAL | 3 refills | Status: DC

## 2017-11-28 NOTE — Patient Instructions (Signed)
It was nice seeing you again today Ms. Carchi!  I have placed a new referral to cardiology for you. They should call you with the date and time of this appointment. IF YOU DO NOT HEAR FROM THEM IN ONE WEEK, PLEASE CALL TO LET ME KNOW.   Please continue taking your aspirin and atorvastatin as you have been.   If you have any questions or concerns, please feel free to call the clinic.   Be well,  Dr. Avon Gully

## 2017-11-28 NOTE — Progress Notes (Signed)
   Subjective:   Patient: Heather Robbins       Birthdate: 05-18-1946       MRN: [id number]      HPI  Heather Robbins is a 72 y.o. female presenting for stroke f/u.   CVA f/u Patient last seen for hospital f/u in 08/2017 after discharge after stroke. Was doing well at that visit, though had not yet seen neurology or cardiology. Patient has since seen neuro, who have not made any med adjustments, but have ordered cardiac event monitor and recommended cards f/u for possible repeat echo. Patient was to call one week after last appt if she had not heard from cards, but did not do so. Says that she went to PT a few times but felt that this was not helpful, so stopped going. She has been able to start exercising again which she is pleased about. Is taking atorvastatin and aspirin as prescribed. Did buy a home wrist BP cuff and was getting BPs with systolics in low 599J, however this broke and she has not bought another one. Generally feels well and has no complaints today.   Smoking status reviewed. Patient is never smoker.   Review of Systems See HPI.     Objective:  Physical Exam  Constitutional: She is oriented to person, place, and time. She appears well-developed and well-nourished. No distress.  HENT:  Head: Normocephalic and atraumatic.  Cardiovascular: Normal rate.  Pulmonary/Chest: Effort normal. No respiratory distress.  Neurological: She is alert and oriented to person, place, and time.  Skin: Skin is warm and dry.  Psychiatric: She has a normal mood and affect. Her behavior is normal.   Assessment & Plan:  Stroke (cerebrum) ([place]) Doing well overall. Still has not seen cardiology. F/u with cards still being recommended by neuro. As such, placed a second cardiology referral for patient today, and again urged her to call our office if she has not heard from them in one week. BP in office today above neuro's goal of 130/80 (140/89 in office today), however patient's reported home BPs all with  systolics in low 570V, so will not start BP med today. Would continue to monitor at future visits.  - Continue ASA, atorvastatin - No labs needed today - Ensure patient establishes with cards - Monitor BP at future visits   Adin Hector, MD, MPH PGY-3 [facility] Pager [phone]

## 2017-11-28 NOTE — Assessment & Plan Note (Signed)
Doing well overall. Still has not seen cardiology. F/u with cards still being recommended by neuro. As such, placed a second cardiology referral for patient today, and again urged her to call our office if she has not heard from them in one week. BP in office today above neuro's goal of 130/80 (140/89 in office today), however patient's reported home BPs all with systolics in low 267T, so will not start BP med today. Would continue to monitor at future visits.  - Continue ASA, atorvastatin - No labs needed today - Ensure patient establishes with cards - Monitor BP at future visits

## 2018-01-15 ENCOUNTER — Encounter (INDEPENDENT_AMBULATORY_CARE_PROVIDER_SITE_OTHER): Payer: Self-pay

## 2018-01-15 ENCOUNTER — Encounter: Payer: Self-pay | Admitting: Physician Assistant

## 2018-01-15 ENCOUNTER — Ambulatory Visit: Payer: Medicare Other | Admitting: Internal Medicine

## 2018-01-15 VITALS — BP 142/84 | HR 66 | Ht 65.0 in | Wt 113.8 lb

## 2018-01-15 DIAGNOSIS — Z8673 Personal history of transient ischemic attack (TIA), and cerebral infarction without residual deficits: Secondary | ICD-10-CM | POA: Diagnosis not present

## 2018-01-15 DIAGNOSIS — E785 Hyperlipidemia, unspecified: Secondary | ICD-10-CM

## 2018-01-15 DIAGNOSIS — I10 Essential (primary) hypertension: Secondary | ICD-10-CM | POA: Diagnosis not present

## 2018-01-15 NOTE — Patient Instructions (Addendum)
Medication Instructions:  1. Your physician recommends that you continue on your current medications as directed. Please refer to the Current Medication list given to you today.   Labwork: NONE ORDERED TODAY  Testing/Procedures: 1. DR. Jackalyn Lombard NURSE JENNY HAS YOU SCHEDULED FOR YOUR PROCEDURE      (LOOP RECORDER) TO BE DONE ON 01/23/18.   2. YOU WILL NEED TO ARRIVE AT [facility] MAIN ENTRANCE AND    GO TO ADMITTING.   3. YOU ARE ABLE TO EAT BREAKFAST THAT DAY AS WELL AS TAKE ALL OF YOUR     MEDICATIONS AS YOU NORMALLY WOULD.    Follow-Up: YOU WILL NEED TO HAVE A FOLLOW WITH OUR DEVICE CLINIC POST PROCEDURE FOR A WOUND CHECK. February 07, 2018 @ 10 AM.   Any Other Special Instructions Will Be Listed Below (If Applicable). MONITOR BLOOD PRESSURE AND FOLLOW UP WITH PRIMARY CARE IF CONSISTENTLY RUNNING 130/80 OR HIGHER   If you need a refill on your cardiac medications before your next appointment, please call your pharmacy.  YOU HAVE BEEN ADVISED TO FOLLOW A LOW SALT DIET. SEE BELOW:   Low-Sodium Eating Plan Sodium, which is an element that makes up salt, helps you maintain a healthy balance of fluids in your body. Too much sodium can increase your blood pressure and cause fluid and waste to be held in your body. Your health care provider or dietitian may recommend following this plan if you have high blood pressure (hypertension), kidney disease, liver disease, or heart failure. Eating less sodium can help lower your blood pressure, reduce swelling, and protect your heart, liver, and kidneys. What are tips for following this plan? General guidelines  Most people on this plan should limit their sodium intake to 1,500-2,000 mg (milligrams) of sodium each day. Reading food labels  The Nutrition Facts label lists the amount of sodium in one serving of the food. If you eat more than one serving, you must multiply the listed amount of sodium by the number of servings.  Choose foods  with less than 140 mg of sodium per serving.  Avoid foods with 300 mg of sodium or more per serving. Shopping  Look for lower-sodium products, often labeled as "low-sodium" or "no salt added."  Always check the sodium content even if foods are labeled as "unsalted" or "no salt added".  Buy fresh foods. ? Avoid canned foods and premade or frozen meals. ? Avoid canned, cured, or processed meats  Buy breads that have less than 80 mg of sodium per slice. Cooking  Eat more home-cooked food and less restaurant, buffet, and fast food.  Avoid adding salt when cooking. Use salt-free seasonings or herbs instead of table salt or sea salt. Check with your health care provider or pharmacist before using salt substitutes.  Cook with plant-based oils, such as canola, sunflower, or olive oil. Meal planning  When eating at a restaurant, ask that your food be prepared with less salt or no salt, if possible.  Avoid foods that contain MSG (monosodium glutamate). MSG is sometimes added to [place] food, bouillon, and some canned foods. What foods are recommended? The items listed may not be a complete list. Talk with your dietitian about what dietary choices are best for you. Grains Low-sodium cereals, including oats, puffed wheat and rice, and shredded wheat. Low-sodium crackers. Unsalted rice. Unsalted pasta. Low-sodium bread. Whole-grain breads and whole-grain pasta. Vegetables Fresh or frozen vegetables. "No salt added" canned vegetables. "No salt added" tomato sauce and paste. Low-sodium  or reduced-sodium tomato and vegetable juice. Fruits Fresh, frozen, or canned fruit. Fruit juice. Meats and other protein foods Fresh or frozen (no salt added) meat, poultry, seafood, and fish. Low-sodium canned tuna and salmon. Unsalted nuts. Dried peas, beans, and lentils without added salt. Unsalted canned beans. Eggs. Unsalted nut butters. Dairy Milk. Soy milk. Cheese that is naturally low in sodium, such as  ricotta cheese, fresh mozzarella, or Swiss cheese Low-sodium or reduced-sodium cheese. Cream cheese. Yogurt. Fats and oils Unsalted butter. Unsalted margarine with no trans fat. Vegetable oils such as canola or olive oils. Seasonings and other foods Fresh and dried herbs and spices. Salt-free seasonings. Low-sodium mustard and ketchup. Sodium-free salad dressing. Sodium-free light mayonnaise. Fresh or refrigerated horseradish. Lemon juice. Vinegar. Homemade, reduced-sodium, or low-sodium soups. Unsalted popcorn and pretzels. Low-salt or salt-free chips. What foods are not recommended? The items listed may not be a complete list. Talk with your dietitian about what dietary choices are best for you. Grains Instant hot cereals. Bread stuffing, pancake, and biscuit mixes. Croutons. Seasoned rice or pasta mixes. Noodle soup cups. Boxed or frozen macaroni and cheese. Regular salted crackers. Self-rising flour. Vegetables Sauerkraut, pickled vegetables, and relishes. Olives. [place] fries. Onion rings. Regular canned vegetables (not low-sodium or reduced-sodium). Regular canned tomato sauce and paste (not low-sodium or reduced-sodium). Regular tomato and vegetable juice (not low-sodium or reduced-sodium). Frozen vegetables in sauces. Meats and other protein foods Meat or fish that is salted, canned, smoked, spiced, or pickled. Bacon, ham, sausage, hotdogs, corned beef, chipped beef, packaged lunch meats, salt pork, jerky, pickled herring, anchovies, regular canned tuna, sardines, salted nuts. Dairy Processed cheese and cheese spreads. Cheese curds. Blue cheese. Feta cheese. String cheese. Regular cottage cheese. Buttermilk. Canned milk. Fats and oils Salted butter. Regular margarine. Ghee. Bacon fat. Seasonings and other foods Onion salt, garlic salt, seasoned salt, table salt, and sea salt. Canned and packaged gravies. Worcestershire sauce. Tartar sauce. Barbecue sauce. Teriyaki sauce. Soy sauce,  including reduced-sodium. Steak sauce. Fish sauce. Oyster sauce. Cocktail sauce. Horseradish that you find on the shelf. Regular ketchup and mustard. Meat flavorings and tenderizers. Bouillon cubes. Hot sauce and Tabasco sauce. Premade or packaged marinades. Premade or packaged taco seasonings. Relishes. Regular salad dressings. Salsa. Potato and tortilla chips. Corn chips and puffs. Salted popcorn and pretzels. Canned or dried soups. Pizza. Frozen entrees and pot pies. Summary  Eating less sodium can help lower your blood pressure, reduce swelling, and protect your heart, liver, and kidneys.  Most people on this plan should limit their sodium intake to 1,500-2,000 mg (milligrams) of sodium each day.  Canned, boxed, and frozen foods are high in sodium. Restaurant foods, fast foods, and pizza are also very high in sodium. You also get sodium by adding salt to food.  Try to cook at home, eat more fresh fruits and vegetables, and eat less fast food, canned, processed, or prepared foods. This information is not intended to replace advice given to you by your health care provider. Make sure you discuss any questions you have with your health care provider. Document Released: 01/05/2002 Document Revised: 07/09/2016 Document Reviewed: 07/09/2016 Elsevier Interactive Patient Education  [employer].

## 2018-01-15 NOTE — Progress Notes (Signed)
ELECTROPHYSIOLOGY CONSULT NOTE  Patient ID: Heather Robbins MRN: [id number], DOB/AGE: 72-Jul-1947   Admit date: (Not on file) Date of Consult: 01/15/2018  Primary Physician: Nuala Alpha, DO Referring:  Dr Erlinda Hong Reason for Consultation: Cryptogenic stroke; recommendations regarding Implantable Loop Recorder  History of Present Illness Heather Robbins was admitted 09/12/17 with acute CVA. she has been monitored on telemetry which has demonstrated no arrhythmias. No cause has been identified. Inpatient stroke work-up was performed by Dr Erlinda Hong at [facility].   EP has been asked to evaluate for further monitoring to assess for afib as the possible cause for her stroke.  Past Medical History Past Medical History:  Diagnosis Date  . HLD (hyperlipidemia)   . HTN (hypertension)   . Stroke [facility]) 08/2017    Past Surgical History Past Surgical History:  Procedure Laterality Date  . CESAREAN SECTION      Allergies/Intolerances No Known Allergies  Current Meds  Medication Sig  . aspirin 81 MG tablet Take 1 tablet (81 mg total) by mouth daily.  Marland Kitchen atorvastatin (LIPITOR) 80 MG tablet Take 1 tablet (80 mg total) by mouth daily at 6 PM.   FH: The patient has a family history of HTN  Social History Social History   Socioeconomic History  . Marital status: Widowed    Spouse name: Not on file  . Number of children: 1  . Years of education: Not on file  . Highest education level: Not on file  Occupational History  . 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 Financial resource strain: Not on file  . Food insecurity:    Worry: Not on file    Inability: Not on file  . Transportation needs:    Medical: Not on file    Non-medical: Not on file  Tobacco Use  . Smoking status: Never Smoker  . Smokeless tobacco: Never Used  Substance and Sexual Activity  . Alcohol use: Yes    Alcohol/week: 0.6 oz    Types: 1 Glasses of wine per week   Frequency: Never    Comment: occasionally  . Drug use: No  . Sexual activity: Never  Lifestyle  . Physical activity:    Days per week: Not on file    Minutes per session: Not on file  . Stress: Not on file  Relationships  . Social connections:    Talks on phone: Not on file    Gets together: Not on file    Attends religious service: Not on file    Active member of club or organization: Not on file    Attends meetings of clubs or organizations: Not on file    Relationship status: Not on file  . Intimate partner violence:    Fear of current or ex partner: Not on file    Emotionally abused: Not on file    Physically abused: Not on file    Forced sexual activity: Not on file  Other Topics Concern  . Not on file  Social History Narrative   1 daughter, 1 grandchild   Lives in [place] 30 years     Review of Systems General: No chills, fever, night sweats or weight changes  Cardiovascular:  No chest pain, dyspnea on exertion, edema, orthopnea, palpitations, paroxysmal nocturnal dyspnea Dermatological: No rash, lesions or masses Respiratory: No cough, dyspnea Urologic: No hematuria, dysuria Abdominal: No nausea, vomiting, diarrhea, bright red blood per rectum, melena, or hematemesis Neurologic: No  visual changes, weakness, changes in mental status All other systems reviewed and are otherwise negative except as noted above.  Physical Exam Blood pressure (!) 142/84, pulse 66, height 5\' 5"  (1.651 m), weight 113 lb 12.8 oz (51.6 kg).  General: Well developed, well appearing 72 y.o. female in no acute distress. HEENT: Normocephalic, atraumatic. EOMs intact. Sclera nonicteric. Oropharynx clear.  Neck: Supple   Lungs: normal WOB Heart: RRR  Abdomen: Soft   Extremities: No clubbing, cyanosis or edema . Psych: Normal affect. Musculoskeletal: No kyphosis. Skin: Intact. Warm and dry.     Labs Lab Results  Component Value Date   WBC 5.1 09/13/2017   HGB 12.4 09/13/2017   HCT  39.8 09/13/2017   MCV 83.4 09/13/2017   PLT 223 09/13/2017   No results for input(s): NA, K, CL, CO2, BUN, CREATININE, CALCIUM, PROT, BILITOT, ALKPHOS, ALT, AST, GLUCOSE in the last 168 hours.  Invalid input(s): LABALBU No results for input(s): INR in the last 72 hours.   Echocardiogram  09/13/17 reveals EF 55%  12-lead ECG sinus rhythm (all ekgs in epic reviewed)  Assessment and Plan 1. Cryptogenic stroke The patient has previously had stroke of unknown cause.  Given multiple locations, Dr Erlinda Hong worries about afib as a possible cause. She has worn telemetry during her hospitalization and did not have afib. The indication for loop recorder insertion / monitoring for AF in setting of cryptogenic stroke was discussed with the patient. The loop recorder insertion procedure was reviewed in detail including risks and benefits. These risks include but are not limited to bleeding and infection. The patient expressed verbal understanding and agrees to proceed. The patient was also counseled regarding wound care and device follow-up.  30 day monitor was offered as an alternative.  She is clear that she would like to proceed with ILR and is not interested in 30 day monitor at this time.  We will schedule the procedure at the next available time.  2. HTN Stable No change required today  3. HL Stable No change required today  Signed, [name] MD 01/15/2018, 5:37 PM

## 2018-01-15 NOTE — Progress Notes (Deleted)
Cardiology Office Note:    Date:  01/15/2018   ID:  Heather Robbins, DOB 1946/03/27, MRN [id number]  PCP:  Nuala Alpha, DO  Cardiologist:  No primary care provider on file.   Referring MD: Sydnee Levans*   No chief complaint on file. ***  History of Present Illness:    Heather Robbins is a 72 y.o. female with *** who is being seen today for the evaluation of *** at the request of Sydnee Levans*.   Heather Robbins *** She was admitted 2/14-2/15 with left brain stroke.  Echocardiogram demonstrated normal LV function.  She was placed on aspirin and atorvastatin.  She was evaluated by neurology.  Notes indicate that there was a small infarct in the left frontoparietal junction.  There was also a questionable right subacute periventricular punctate infarct and a remote left basal ganglia/thalamus lacunar infarct.  There was no stenosis on MRA.  The stroke was felt to be most consistent with small vessel disease.  Due to bilateral involvement, a 30-day event monitor was recommended to rule out atrial fibrillation.  However, this has not been done. No flowsheet data found.  Prior CV studies:   The following studies were reviewed today:  Echo 09/13/2017 Abnormal septal motion, EF 55, normal wall motion, normal diastolic function, PFO cannot be excluded, trivial pericardial effusion  Brain MRI 09/12/2017 IMPRESSION: 1. Small focus of acute ischemia at the left frontoparietal junction, at the base of the central sulcus. No associated hemorrhage or mass effect. 2. Diffuse confluent white matter disease. 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 Severe chronic demyelinating disease could have this appearance, but the features are not particularly suggestive of this and there is no abnormal contrast enhancement. 3. No emergent large vessel occlusion or high-grade intracranial or cervical arterial stenosis.  Head CT  09/12/2017 IMPRESSION: 1. Findings concerning for acute infarct in the left basal ganglia.    Past Medical History:  Diagnosis Date  . Stroke [facility])     Past Surgical History:  Procedure Laterality Date  . CESAREAN SECTION      Current Medications: No outpatient medications have been marked as taking for the 01/15/18 encounter (Appointment) with Richardson Dopp T, PA-C.     Allergies:   Patient has no known allergies.   Social History   Socioeconomic History  . Marital status: Widowed    Spouse name: Not on file  . Number of children: Not on file  . Years of education: Not on file  . Highest education level: Not on file  Occupational History  . Not on file  Social Needs  . Financial resource strain: Not on file  . Food insecurity:    Worry: Not on file    Inability: Not on file  . Transportation needs:    Medical: Not on file    Non-medical: Not on file  Tobacco Use  . Smoking status: Never Smoker  . Smokeless tobacco: Never Used  Substance and Sexual Activity  . Alcohol use: Yes    Alcohol/week: 0.6 oz    Types: 1 Glasses of wine per week    Frequency: Never    Comment: occasionally  . Drug use: No  . Sexual activity: Never  Lifestyle  . Physical activity:    Days per week: Not on file    Minutes per session: Not on file  . Stress: Not on file  Relationships  . Social connections:    Talks on phone: Not on  file    Gets together: Not on file    Attends religious service: Not on file    Active member of club or organization: Not on file    Attends meetings of clubs or organizations: Not on file    Relationship status: Not on file  Other Topics Concern  . Not on file  Social History Narrative  . Not on file     Family Hx: The patient's family history includes Asthma in her father; Diabetes in her mother; Emphysema in her father; Heart attack in her father, maternal grandmother, and mother.  ROS:   Please see the history of present illness.    ROS  All other systems reviewed and are negative.   EKGs/Labs/Other Test Reviewed:    EKG:  EKG is *** ordered today.  The ekg ordered today demonstrates ***  Recent Labs: 09/12/2017: ALT 17 09/13/2017: BUN 9; Creatinine, Ser 0.79; Hemoglobin 12.4; Platelets 223; Potassium 3.8; Sodium 142; TSH 2.553   Recent Lipid Panel Lab Results  Component Value Date/Time   CHOL 260 (H) 09/13/2017 12:56 AM   TRIG 82 09/13/2017 12:56 AM   HDL 46 09/13/2017 12:56 AM   CHOLHDL 5.7 09/13/2017 12:56 AM   LDLCALC 198 (H) 09/13/2017 12:56 AM    Physical Exam:    VS:  There were no vitals taken for this visit.    Wt Readings from Last 3 Encounters:  11/28/17 111 lb 3.2 oz (50.4 kg)  11/04/17 112 lb 3.2 oz (50.9 kg)  09/19/17 113 lb (51.3 kg)     ***Physical Exam  ASSESSMENT & PLAN:    No diagnosis found.***  Dispo:  No follow-ups on file.   Medication Adjustments/Labs and Tests Ordered: Current medicines are reviewed at length with the patient today.  Concerns regarding medicines are outlined above.  Orders/Tests:  No orders of the defined types were placed in this encounter.  Medication changes: No orders of the defined types were placed in this encounter.  Signed, Richardson Dopp, PA-C  01/15/2018 1:49 PM    [facility] [place], [place], [place]  [address] Phone: [phone]; Fax: [phone]

## 2018-01-15 NOTE — H&P (View-Only) (Signed)
ELECTROPHYSIOLOGY CONSULT NOTE  Patient ID: Heather Robbins MRN: [id number], DOB/AGE: 1946/04/06   Admit date: (Not on file) Date of Consult: 01/15/2018  Primary Physician: Nuala Alpha, DO Referring:  Dr Erlinda Hong Reason for Consultation: Cryptogenic stroke; recommendations regarding Implantable Loop Recorder  History of Present Illness Heather Robbins was admitted 09/12/17 with acute CVA. she has been monitored on telemetry which has demonstrated no arrhythmias. No cause has been identified. Inpatient stroke work-up was performed by Dr Erlinda Hong at [facility].   EP has been asked to evaluate for further monitoring to assess for afib as the possible cause for her stroke.  Past Medical History Past Medical History:  Diagnosis Date  . HLD (hyperlipidemia)   . HTN (hypertension)   . Stroke [facility]) 08/2017    Past Surgical History Past Surgical History:  Procedure Laterality Date  . CESAREAN SECTION      Allergies/Intolerances No Known Allergies  Current Meds  Medication Sig  . aspirin 81 MG tablet Take 1 tablet (81 mg total) by mouth daily.  Marland Kitchen atorvastatin (LIPITOR) 80 MG tablet Take 1 tablet (80 mg total) by mouth daily at 6 PM.   FH: The patient has a family history of HTN  Social History Social History   Socioeconomic History  . Marital status: Widowed    Spouse name: Not on file  . Number of children: 1  . Years of education: Not on file  . Highest education level: Not on file  Occupational History  . 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 Financial resource strain: Not on file  . Food insecurity:    Worry: Not on file    Inability: Not on file  . Transportation needs:    Medical: Not on file    Non-medical: Not on file  Tobacco Use  . Smoking status: Never Smoker  . Smokeless tobacco: Never Used  Substance and Sexual Activity  . Alcohol use: Yes    Alcohol/week: 0.6 oz    Types: 1 Glasses of wine per week   Frequency: Never    Comment: occasionally  . Drug use: No  . Sexual activity: Never  Lifestyle  . Physical activity:    Days per week: Not on file    Minutes per session: Not on file  . Stress: Not on file  Relationships  . Social connections:    Talks on phone: Not on file    Gets together: Not on file    Attends religious service: Not on file    Active member of club or organization: Not on file    Attends meetings of clubs or organizations: Not on file    Relationship status: Not on file  . Intimate partner violence:    Fear of current or ex partner: Not on file    Emotionally abused: Not on file    Physically abused: Not on file    Forced sexual activity: Not on file  Other Topics Concern  . Not on file  Social History Narrative   1 daughter, 1 grandchild   Lives in [place] 30 years     Review of Systems General: No chills, fever, night sweats or weight changes  Cardiovascular:  No chest pain, dyspnea on exertion, edema, orthopnea, palpitations, paroxysmal nocturnal dyspnea Dermatological: No rash, lesions or masses Respiratory: No cough, dyspnea Urologic: No hematuria, dysuria Abdominal: No nausea, vomiting, diarrhea, bright red blood per rectum, melena, or hematemesis Neurologic: No  visual changes, weakness, changes in mental status All other systems reviewed and are otherwise negative except as noted above.  Physical Exam Blood pressure (!) 142/84, pulse 66, height 5\' 5"  (1.651 m), weight 113 lb 12.8 oz (51.6 kg).  General: Well developed, well appearing 72 y.o. female in no acute distress. HEENT: Normocephalic, atraumatic. EOMs intact. Sclera nonicteric. Oropharynx clear.  Neck: Supple   Lungs: normal WOB Heart: RRR  Abdomen: Soft   Extremities: No clubbing, cyanosis or edema . Psych: Normal affect. Musculoskeletal: No kyphosis. Skin: Intact. Warm and dry.     Labs Lab Results  Component Value Date   WBC 5.1 09/13/2017   HGB 12.4 09/13/2017   HCT  39.8 09/13/2017   MCV 83.4 09/13/2017   PLT 223 09/13/2017   No results for input(s): NA, K, CL, CO2, BUN, CREATININE, CALCIUM, PROT, BILITOT, ALKPHOS, ALT, AST, GLUCOSE in the last 168 hours.  Invalid input(s): LABALBU No results for input(s): INR in the last 72 hours.   Echocardiogram  09/13/17 reveals EF 55%  12-lead ECG sinus rhythm (all ekgs in epic reviewed)  Assessment and Plan 1. Cryptogenic stroke The patient has previously had stroke of unknown cause.  Given multiple locations, Dr Erlinda Hong worries about afib as a possible cause. She has worn telemetry during her hospitalization and did not have afib. The indication for loop recorder insertion / monitoring for AF in setting of cryptogenic stroke was discussed with the patient. The loop recorder insertion procedure was reviewed in detail including risks and benefits. These risks include but are not limited to bleeding and infection. The patient expressed verbal understanding and agrees to proceed. The patient was also counseled regarding wound care and device follow-up.  30 day monitor was offered as an alternative.  She is clear that she would like to proceed with ILR and is not interested in 30 day monitor at this time.  We will schedule the procedure at the next available time.  2. HTN Stable No change required today  3. HL Stable No change required today  Signed, [name] MD 01/15/2018, 5:37 PM

## 2018-01-17 ENCOUNTER — Telehealth: Payer: Self-pay | Admitting: Internal Medicine

## 2018-01-17 NOTE — Telephone Encounter (Signed)
New Message  Pt states she has questions about her loop recorder and wants to speak with a nurse   1. Has your device fired? no  2. Is you device beeping? no  3. Are you experiencing draining or swelling at device site? no  4. Are you calling to see if we received your device transmission? no  5. Have you passed out? no    Please route to [place]

## 2018-01-17 NOTE — Telephone Encounter (Signed)
Attempted to call patient x 1 - VM not set up.

## 2018-01-17 NOTE — Telephone Encounter (Signed)
Patient called with several questions regarding her upcoming ILR implant procedure. Patient states that she's a very thin woman and wanted to know how far the device would stick out from her chest. I explained to her that she would be able to palpate the device, but it's small so it shouldn't be very noticeable. I assured her that Dr.Allred has implanted many "thin" people and will know exactly where to place the device. Patient asked if she would be ok to play "pickle ball" the day after the procedure. I told her that she may not feel up to playing "pickle ball" the next day. I also told her that Dr.Allred may restrict her from playing "pickle ball" for a few days post implant. I told her that she should ask him this question when she goes in for the procedure. Patient also asked about the billing for the procedure. I explained to patient that the procedure will be billed through her insurance, but she would need to talk to either the insurance company or the billing department to find out how much she will be responsible for paying. Patient verbalized understanding and appreciation of information.

## 2018-01-17 NOTE — Telephone Encounter (Signed)
Patient called back and stated that she is small and her skin is thin she wants to know if the loop recorder is going to show much under the skin. She wants to know if she can play pickle ball the day after the surgery. She is left handed and wants to know how this will affect her while she plays pickel ball. Call routed to [place].

## 2018-01-23 ENCOUNTER — Encounter (HOSPITAL_COMMUNITY)
Admission: RE | Disposition: A | Discharge status: Home / Self Care | Payer: Self-pay | Source: Ambulatory Visit | Attending: Internal Medicine

## 2018-01-23 ENCOUNTER — Encounter (HOSPITAL_COMMUNITY): Payer: Self-pay | Admitting: Internal Medicine

## 2018-01-23 ENCOUNTER — Ambulatory Visit (HOSPITAL_COMMUNITY)
Admission: RE | Admit: 2018-01-23 | Discharge: 2018-01-23 | Disposition: A | Discharge status: Home / Self Care | Payer: Medicare Other | Source: Ambulatory Visit | Attending: Internal Medicine | Admitting: Internal Medicine

## 2018-01-23 DIAGNOSIS — I639 Cerebral infarction, unspecified: Secondary | ICD-10-CM | POA: Diagnosis not present

## 2018-01-23 DIAGNOSIS — Z8249 Family history of ischemic heart disease and other diseases of the circulatory system: Secondary | ICD-10-CM | POA: Diagnosis not present

## 2018-01-23 DIAGNOSIS — I10 Essential (primary) hypertension: Secondary | ICD-10-CM | POA: Diagnosis not present

## 2018-01-23 DIAGNOSIS — Z7982 Long term (current) use of aspirin: Secondary | ICD-10-CM | POA: Insufficient documentation

## 2018-01-23 DIAGNOSIS — I6389 Other cerebral infarction: Secondary | ICD-10-CM | POA: Diagnosis not present

## 2018-01-23 DIAGNOSIS — E785 Hyperlipidemia, unspecified: Secondary | ICD-10-CM | POA: Diagnosis not present

## 2018-01-23 HISTORY — PX: LOOP RECORDER INSERTION: EP1214

## 2018-01-23 SURGERY — LOOP RECORDER INSERTION

## 2018-01-23 MED ORDER — LIDOCAINE-EPINEPHRINE 1 %-1:100000 IJ SOLN
INTRAMUSCULAR | Status: DC | PRN
  Administered 2018-01-23: 20 mL

## 2018-01-23 MED ORDER — LIDOCAINE-EPINEPHRINE 1 %-1:100000 IJ SOLN
INTRAMUSCULAR | Status: AC
  Filled 2018-01-23: qty 1

## 2018-01-23 SURGICAL SUPPLY — 2 items
LOOP REVEAL LINQSYS (Prosthesis & Implant Heart) ×3 IMPLANT
PACK LOOP INSERTION (CUSTOM PROCEDURE TRAY) ×3 IMPLANT

## 2018-01-23 NOTE — Interval H&P Note (Signed)
History and Physical Interval Note:  01/23/2018 7:26 AM  Heather Robbins  has presented today for surgery, with the diagnosis of stroke  The various methods of treatment have been discussed with the patient and family. After consideration of risks, benefits and other options for treatment, the patient has consented to  Procedure(s): LOOP RECORDER INSERTION (N/A) as a surgical intervention .  The patient's history has been reviewed, patient examined, no change in status, stable for surgery.  I have reviewed the patient's chart and labs.  Questions were answered to the patient's satisfaction.     [name]

## 2018-02-05 ENCOUNTER — Ambulatory Visit (INDEPENDENT_AMBULATORY_CARE_PROVIDER_SITE_OTHER): Payer: Self-pay | Admitting: *Deleted

## 2018-02-05 DIAGNOSIS — I639 Cerebral infarction, unspecified: Secondary | ICD-10-CM

## 2018-02-05 LAB — CUP PACEART INCLINIC DEVICE CHECK
Date Time Interrogation Session: 20190710102323
Implantable Pulse Generator Implant Date: 20190627

## 2018-02-05 NOTE — Progress Notes (Signed)
Wound check appointment. Steri-strips removed. Wound without redness or edema. Incision edges approximated, wound well healed. Normal device function. Battery status: good. R-waves 0.34mV.No symptom, tachy, or AF episodes. Patient educated about wound care and Carelink monitor. Monthly summary reports and ROV with JA PRN.

## 2018-02-25 ENCOUNTER — Ambulatory Visit (INDEPENDENT_AMBULATORY_CARE_PROVIDER_SITE_OTHER): Payer: Medicare Other | Admitting: *Deleted

## 2018-02-25 DIAGNOSIS — I639 Cerebral infarction, unspecified: Secondary | ICD-10-CM | POA: Diagnosis not present

## 2018-02-25 NOTE — Progress Notes (Signed)
Carelink Summary Report / Loop Recorder 

## 2018-03-06 ENCOUNTER — Ambulatory Visit: Payer: Medicare Other | Admitting: Adult Health

## 2018-04-01 ENCOUNTER — Telehealth: Payer: Self-pay | Admitting: Cardiology

## 2018-04-01 ENCOUNTER — Ambulatory Visit (INDEPENDENT_AMBULATORY_CARE_PROVIDER_SITE_OTHER): Payer: Medicare Other | Admitting: *Deleted

## 2018-04-01 DIAGNOSIS — I639 Cerebral infarction, unspecified: Secondary | ICD-10-CM | POA: Diagnosis not present

## 2018-04-01 NOTE — Progress Notes (Signed)
Carelink Summary Report / Loop Recorder 

## 2018-04-01 NOTE — Telephone Encounter (Signed)
Patient called and stated that she received a bill for the loop recorder. I informed her that we do bill for the loop recorder every month b/c the monitor does send a monthly summary report. I informed her that I spoke w/ my co workers and they informed me that they typically don't discuss the billing portion of the loop recorder unless that patient is ask about billing. I apologized to her for not informing her of this prior to the billing and that I would request that my co workers would consider discussing billing at the wound check appt. Pt verbalized understanding and appreciation.

## 2018-04-10 LAB — CUP PACEART REMOTE DEVICE CHECK
Date Time Interrogation Session: 20190912085021
MDC IDC PG IMPLANT DT: 20190627

## 2018-04-13 ENCOUNTER — Other Ambulatory Visit: Payer: Self-pay | Admitting: Adult Health

## 2018-04-13 DIAGNOSIS — I63512 Cerebral infarction due to unspecified occlusion or stenosis of left middle cerebral artery: Secondary | ICD-10-CM

## 2018-04-25 LAB — CUP PACEART REMOTE DEVICE CHECK
Date Time Interrogation Session: 20190927114629
MDC IDC PG IMPLANT DT: 20190627

## 2018-05-02 ENCOUNTER — Ambulatory Visit (INDEPENDENT_AMBULATORY_CARE_PROVIDER_SITE_OTHER): Payer: Medicare Other | Admitting: *Deleted

## 2018-05-02 DIAGNOSIS — I639 Cerebral infarction, unspecified: Secondary | ICD-10-CM | POA: Diagnosis not present

## 2018-05-03 NOTE — Progress Notes (Signed)
Carelink Summary Report / Loop Recorder 

## 2018-05-06 LAB — CUP PACEART REMOTE DEVICE CHECK
Date Time Interrogation Session: 20191008110036
MDC IDC PG IMPLANT DT: 20190627

## 2018-06-04 ENCOUNTER — Ambulatory Visit (INDEPENDENT_AMBULATORY_CARE_PROVIDER_SITE_OTHER): Payer: Medicare Other | Admitting: *Deleted

## 2018-06-04 DIAGNOSIS — I639 Cerebral infarction, unspecified: Secondary | ICD-10-CM | POA: Diagnosis not present

## 2018-06-04 NOTE — Progress Notes (Signed)
Carelink Summary Report / Loop Recorder 

## 2018-06-17 ENCOUNTER — Telehealth: Payer: Self-pay

## 2018-06-17 NOTE — Telephone Encounter (Signed)
Pt called nurse line stating she is having some dental work done on Thursday, and wants to know if taking her daily aspirin and lipitor are ok?

## 2018-06-18 NOTE — Telephone Encounter (Signed)
Called and left voice message for patient concerning Lipitor and aspirin regimen prior to dental work.  Heather Robbins, [place]

## 2018-07-07 ENCOUNTER — Ambulatory Visit (INDEPENDENT_AMBULATORY_CARE_PROVIDER_SITE_OTHER): Payer: Medicare Other

## 2018-07-07 DIAGNOSIS — I639 Cerebral infarction, unspecified: Secondary | ICD-10-CM

## 2018-07-08 NOTE — Progress Notes (Signed)
Carelink Summary Report / Loop Recorder 

## 2018-07-18 ENCOUNTER — Other Ambulatory Visit: Payer: Self-pay | Admitting: Adult Health

## 2018-07-18 DIAGNOSIS — I63512 Cerebral infarction due to unspecified occlusion or stenosis of left middle cerebral artery: Secondary | ICD-10-CM

## 2018-07-21 NOTE — Telephone Encounter (Signed)
Thank you Dr.Lockamy for your response. Have a good day.

## 2018-08-11 ENCOUNTER — Ambulatory Visit (INDEPENDENT_AMBULATORY_CARE_PROVIDER_SITE_OTHER): Payer: Medicare Other

## 2018-08-11 DIAGNOSIS — I639 Cerebral infarction, unspecified: Secondary | ICD-10-CM

## 2018-08-12 NOTE — Progress Notes (Signed)
Carelink Summary Report / Loop Recorder 

## 2018-08-20 LAB — CUP PACEART REMOTE DEVICE CHECK
Date Time Interrogation Session: 20200122212547
MDC IDC PG IMPLANT DT: 20190627

## 2018-08-26 ENCOUNTER — Other Ambulatory Visit: Payer: Self-pay | Admitting: *Deleted

## 2018-08-26 DIAGNOSIS — I63512 Cerebral infarction due to unspecified occlusion or stenosis of left middle cerebral artery: Secondary | ICD-10-CM

## 2018-08-26 MED ORDER — ASPIRIN 81 MG PO TABS: 81.0000 mg | ORAL_TABLET | Freq: Every day | ORAL | 3 refills | Status: DC

## 2018-08-26 MED ORDER — ATORVASTATIN CALCIUM 80 MG PO TABS: 80.0000 mg | ORAL_TABLET | Freq: Every day | ORAL | 3 refills | Status: DC

## 2018-08-26 NOTE — Telephone Encounter (Signed)
Nuala Alpha, DO  P [facility] [employer]; Fleeger, [place] D, CMA        Can we please call patient and let her know I have sent in prescriptions that she requested and I would like her to schedule an appointment to be seen within the next 2-3 months. Nothing urgent just needs a routine check up. Thanks!   Tim

## 2018-08-26 NOTE — Telephone Encounter (Signed)
Pt called requesting a refill of these meds and a callback if she needed an appt.    Last appt was in May 2019.  LMOVM for pt to return call to schedule an appt. Fleeger, Salome Spotted, CMA

## 2018-08-26 NOTE — Telephone Encounter (Signed)
LMOVM for pt to return call. Fleeger, Jessica Dawn, CMA  

## 2018-09-11 ENCOUNTER — Ambulatory Visit (INDEPENDENT_AMBULATORY_CARE_PROVIDER_SITE_OTHER): Payer: Medicare Other

## 2018-09-11 DIAGNOSIS — I639 Cerebral infarction, unspecified: Secondary | ICD-10-CM

## 2018-09-13 LAB — CUP PACEART REMOTE DEVICE CHECK
Date Time Interrogation Session: 20200215072532
MDC IDC PG IMPLANT DT: 20190627

## 2018-09-23 NOTE — Progress Notes (Signed)
Carelink Summary Report / Loop Recorder 

## 2018-10-14 ENCOUNTER — Other Ambulatory Visit: Payer: Self-pay

## 2018-10-14 ENCOUNTER — Ambulatory Visit (INDEPENDENT_AMBULATORY_CARE_PROVIDER_SITE_OTHER): Payer: Medicare Other | Admitting: *Deleted

## 2018-10-14 DIAGNOSIS — I639 Cerebral infarction, unspecified: Secondary | ICD-10-CM

## 2018-10-15 LAB — CUP PACEART REMOTE DEVICE CHECK
Date Time Interrogation Session: 20200318154329
Implantable Pulse Generator Implant Date: 20190627

## 2018-10-22 NOTE — Progress Notes (Signed)
Carelink Summary Report / Loop Recorder 

## 2018-11-17 ENCOUNTER — Other Ambulatory Visit: Payer: Self-pay

## 2018-11-17 ENCOUNTER — Ambulatory Visit (INDEPENDENT_AMBULATORY_CARE_PROVIDER_SITE_OTHER): Payer: Medicare Other | Admitting: *Deleted

## 2018-11-17 DIAGNOSIS — I639 Cerebral infarction, unspecified: Secondary | ICD-10-CM

## 2018-11-18 LAB — CUP PACEART REMOTE DEVICE CHECK
Date Time Interrogation Session: 20200421083242
Implantable Pulse Generator Implant Date: 20190627

## 2018-11-25 NOTE — Progress Notes (Signed)
Carelink Summary Report / Loop Recorder 

## 2018-12-11 ENCOUNTER — Encounter: Payer: Self-pay | Admitting: Family Medicine

## 2018-12-11 ENCOUNTER — Other Ambulatory Visit: Payer: Self-pay

## 2018-12-11 ENCOUNTER — Ambulatory Visit: Payer: Medicare Other | Admitting: Family Medicine

## 2018-12-11 VITALS — BP 184/104 | HR 81

## 2018-12-11 DIAGNOSIS — Z8673 Personal history of transient ischemic attack (TIA), and cerebral infarction without residual deficits: Secondary | ICD-10-CM

## 2018-12-11 DIAGNOSIS — I639 Cerebral infarction, unspecified: Secondary | ICD-10-CM

## 2018-12-11 DIAGNOSIS — I10 Essential (primary) hypertension: Secondary | ICD-10-CM | POA: Diagnosis not present

## 2018-12-11 LAB — POCT GLYCOSYLATED HEMOGLOBIN (HGB A1C): Hemoglobin A1C: 5.6 % (ref 4.0–5.6)

## 2018-12-11 MED ORDER — LOSARTAN POTASSIUM 25 MG PO TABS: 25.0000 mg | ORAL_TABLET | Freq: Every day | ORAL | 3 refills | Status: DC

## 2018-12-11 NOTE — Progress Notes (Signed)
   CC: HTN  HPI  Dentist checked her BP twice on the R and got 109 systolic both times.   No hx of HTN. Took tylenol OTC for HA, this was 2 weeks ago. No ibuprofen or aleeve or similar.   Thought she didn't need to check BPs at home as she has a loop recorder in place. According to remote checks, no AF episodes seen.   ROS: Denies CP, SOB, abdominal pain, dysuria, changes in BMs.   CC, SH/smoking status, and VS noted  Objective: BP (!) 184/104   Pulse 81   SpO2 97%  Gen: NAD, alert, cooperative, and pleasant. HEENT: NCAT, EOMI, PERRL CV: RRR, no murmur Resp: CTAB, no wheezes, non-labored Ext: No edema, warm Neuro: Alert and oriented, Speech clear, No gross deficits  Assessment and plan:  Essential hypertension Confirmed today and at dentist's office. Has been high at her last visit here 1 year ago. Additional risk for CVA. Start low dose ARB, check home BP. Check BMP today, return in 10 days to recheck BMP and BP. Call if low BPs or feeling badly. Already active (plays pickleball) and does not smoke. BMI appropriate.   Stroke [facility]) Taking ASA, recheck lipids. On high dose statin.    Orders Placed This Encounter  Procedures  . Basic metabolic panel  . Lipid panel  . POCT glycosylated hemoglobin (Hb A1C)    Meds ordered this encounter  Medications  . losartan (COZAAR) 25 MG tablet    Sig: Take 1 tablet (25 mg total) by mouth at bedtime.    Dispense:  90 tablet    Refill:  3    Ralene Ok, MD, PGY3 12/12/2018 1:58 PM

## 2018-12-11 NOTE — Patient Instructions (Signed)
Start the blood pressure medicine I sent.  Take your blood pressure daily and if you feel funny. Call us if it is low (<120 on the top).  Keep walking.

## 2018-12-12 ENCOUNTER — Encounter: Payer: Self-pay | Admitting: Family Medicine

## 2018-12-12 DIAGNOSIS — I10 Essential (primary) hypertension: Secondary | ICD-10-CM | POA: Insufficient documentation

## 2018-12-12 HISTORY — DX: Essential (primary) hypertension: I10

## 2018-12-12 LAB — BASIC METABOLIC PANEL
BUN/Creatinine Ratio: 16 (ref 12–28)
BUN: 14 mg/dL (ref 8–27)
CO2: 27 mmol/L (ref 20–29)
Calcium: 9.8 mg/dL (ref 8.7–10.3)
Chloride: 104 mmol/L (ref 96–106)
Creatinine, Ser: 0.88 mg/dL (ref 0.57–1.00)
GFR calc Af Amer: 76 mL/min/{1.73_m2} (ref >59)
GFR calc non Af Amer: 66 mL/min/{1.73_m2} (ref >59)
Glucose: 104 mg/dL — ABNORMAL HIGH (ref 65–99)
Potassium: 4.2 mmol/L (ref 3.5–5.2)
Sodium: 146 mmol/L — ABNORMAL HIGH (ref 134–144)

## 2018-12-12 LAB — LIPID PANEL
Chol/HDL Ratio: 3.3 ratio (ref 0.0–4.4)
Cholesterol, Total: 152 mg/dL (ref 100–199)
HDL: 46 mg/dL (ref >39)
LDL Calculated: 88 mg/dL (ref 0–99)
Triglycerides: 89 mg/dL (ref 0–149)
VLDL Cholesterol Cal: 18 mg/dL (ref 5–40)

## 2018-12-12 NOTE — Assessment & Plan Note (Signed)
Taking ASA, recheck lipids. On high dose statin.

## 2018-12-12 NOTE — Assessment & Plan Note (Addendum)
Confirmed today and at dentist's office. Has been high at her last visit here 1 year ago. Additional risk for CVA. Start low dose ARB, check home BP. Check BMP today, return in 10 days to recheck BMP and BP. Call if low BPs or feeling badly. Already active (plays pickleball) and does not smoke. BMI appropriate.

## 2018-12-18 ENCOUNTER — Telehealth: Payer: Self-pay | Admitting: Cardiology

## 2018-12-18 NOTE — Telephone Encounter (Signed)
New Message:    Pt says she is out f town. She will not be able to do her remote check tomorrow(12-19-18). She will be back in town on  Monday.

## 2018-12-18 NOTE — Telephone Encounter (Signed)
I called the pt to let her know I rescheduled her home remote appointment for Tuesday 12/23/2018. The pt can do it anytime of the day.

## 2018-12-19 ENCOUNTER — Ambulatory Visit (INDEPENDENT_AMBULATORY_CARE_PROVIDER_SITE_OTHER): Payer: Medicare Other | Admitting: *Deleted

## 2018-12-19 DIAGNOSIS — I639 Cerebral infarction, unspecified: Secondary | ICD-10-CM

## 2018-12-20 LAB — CUP PACEART REMOTE DEVICE CHECK
Date Time Interrogation Session: 20200523130236
Implantable Pulse Generator Implant Date: 20190627

## 2018-12-23 ENCOUNTER — Ambulatory Visit: Payer: Medicare Other | Admitting: Family Medicine

## 2018-12-23 ENCOUNTER — Other Ambulatory Visit: Payer: Self-pay

## 2018-12-23 VITALS — BP 145/90 | HR 75

## 2018-12-23 DIAGNOSIS — I10 Essential (primary) hypertension: Secondary | ICD-10-CM

## 2018-12-23 MED ORDER — LOSARTAN POTASSIUM 25 MG PO TABS: 50.0000 mg | ORAL_TABLET | Freq: Every day | ORAL | 3 refills | Status: DC

## 2018-12-23 NOTE — Assessment & Plan Note (Signed)
Still elevated after starting low dose Losartan 25mg  QHS. It would be ideal if she could get her home readings consistently below 140/90s. - cont BP diary and bring BP cuff to next appointment - Increasing Losartan 25mg  to 50mg  QHS - Rechecking BMP today - F/u in 1 month

## 2018-12-23 NOTE — Patient Instructions (Addendum)
It was great to meet you today! Thank you for letting me participate in your care!  Today, we discussed your blood pressure. I am increasing your dose of Losartan from 25mg  to 50mg . Please start taking two pills at night every night. I am rechecking your blood work today and will notify you if it is abnormal.  At your next appointment in one month please bring your home blood pressure cuff with you and your BP log.  Be well, Harolyn Rutherford, DO PGY-2, [facility]

## 2018-12-23 NOTE — Progress Notes (Signed)
     Subjective: Chief Complaint  Patient presents with  . Hypertension     HPI: Heather Robbins is a 73 y.o. presenting to clinic today to discuss the following:  BP Follow Patient states she is feeling well and has had no side effects since starting Losartan last week. She denies any dizzy spells, feeling like she might pass out, or any episodes of syncope. Overall, she is doing well. She brought in her BP diary with her today and her BP at home ranges from SBP of 150-180s and a DBP of 60-90s. She states she does get nervous before she checks it because she is worried it will be high.  She is taking Losartan at night and checking her BP every morning using an automated arm cuff. We discussed the proper method of taking BP at home.  ROS noted in HPI.   Past Medical, Surgical, Social, and Family History Reviewed & Updated per EMR.   Pertinent Historical Findings include:   Social History   Tobacco Use  Smoking Status Never Smoker  Smokeless Tobacco Never Used    Objective: BP (!) 145/90   Pulse 75   SpO2 97%  Vitals and nursing notes reviewed  Physical Exam Gen: Alert and Oriented x 3, NAD HEENT: Normocephalic, atraumatic CV: RRR, no murmurs, normal S1, S2 split Resp: CTAB, no wheezing, rales, or rhonchi, comfortable work of breathing Ext: no clubbing, cyanosis, or edema; +2 distal pulses bilaterally Skin: warm, dry, intact, no rashes  Assessment/Plan:  Essential hypertension Still elevated after starting low dose Losartan 25mg  QHS. It would be ideal if she could get her home readings consistently below 140/90s. - cont BP diary and bring BP cuff to next appointment - Increasing Losartan 25mg  to 50mg  QHS - Rechecking BMP today - F/u in 1 month   PATIENT EDUCATION PROVIDED: See AVS    Diagnosis and plan along with any newly prescribed medication(s) were discussed in detail with this patient today. The patient verbalized understanding and agreed with the plan.  Patient advised if symptoms worsen return to clinic or ER.    Orders Placed This Encounter  Procedures  . Basic Metabolic Panel    Meds ordered this encounter  Medications  . losartan (COZAAR) 25 MG tablet    Sig: Take 2 tablets (50 mg total) by mouth at bedtime.    Dispense:  90 tablet    Refill:  [place], DO 12/23/2018, 8:53 AM PGY-2 [place]

## 2018-12-24 LAB — BASIC METABOLIC PANEL
BUN/Creatinine Ratio: 16 (ref 12–28)
BUN: 14 mg/dL (ref 8–27)
CO2: 27 mmol/L (ref 20–29)
Calcium: 9.7 mg/dL (ref 8.7–10.3)
Chloride: 103 mmol/L (ref 96–106)
Creatinine, Ser: 0.88 mg/dL (ref 0.57–1.00)
GFR calc Af Amer: 76 mL/min/{1.73_m2} (ref >59)
GFR calc non Af Amer: 66 mL/min/{1.73_m2} (ref >59)
Glucose: 80 mg/dL (ref 65–99)
Potassium: 4.4 mmol/L (ref 3.5–5.2)
Sodium: 145 mmol/L — ABNORMAL HIGH (ref 134–144)

## 2018-12-30 NOTE — Progress Notes (Signed)
Carelink Summary Report / Loop Recorder 

## 2019-01-15 ENCOUNTER — Encounter: Payer: Self-pay | Admitting: Family Medicine

## 2019-01-15 ENCOUNTER — Other Ambulatory Visit: Payer: Self-pay

## 2019-01-15 ENCOUNTER — Ambulatory Visit: Payer: Medicare Other | Admitting: Family Medicine

## 2019-01-15 VITALS — BP 165/70 | HR 67

## 2019-01-15 DIAGNOSIS — I10 Essential (primary) hypertension: Secondary | ICD-10-CM | POA: Diagnosis not present

## 2019-01-15 MED ORDER — LOSARTAN POTASSIUM 50 MG PO TABS: 50.0000 mg | ORAL_TABLET | Freq: Every day | ORAL | 11 refills | Status: DC

## 2019-01-15 MED ORDER — AMLODIPINE BESYLATE 5 MG PO TABS: 5.0000 mg | ORAL_TABLET | Freq: Every day | ORAL | 3 refills | Status: DC

## 2019-01-15 NOTE — Patient Instructions (Addendum)
It was great to see you today! Thank you for letting me participate in your care!  Today, we discussed your continued high blood pressure which is slightly improved from last visit but still above goal. Today we have added amlodipine in addition to Losartan. I have also changed your dosage of Losartan to one 50mg  pill. You can pick this up after you run out of the 25mg  Losartan tablets.  Please take one 5mg  tablet of amlodipine and one 50mg  tablet of Losartan in the evenings.   Please remember to bring your blood pressure diary to your next appointment.  Be well, Harolyn Rutherford, DO PGY-2, [facility]

## 2019-01-15 NOTE — Progress Notes (Signed)
     Subjective: Chief Complaint  Patient presents with  . Hyperglycemia    HPI: Heather Robbins is a 73 y.o. presenting to clinic today to discuss the following:  HTN Heather Robbins is a 73y/o female with PMH of stroke and HTN who presents today for follow up for her high blood pressure. She forgot to bring her home BP diary but states she is checking it at home. She has difficulty remembering all the numbers but states "I'm pretty sure its high". Her BP is high once again on exam in the office.   BP recheck in office with our equipment was 155/85. Her home machine reads higher numbers with systolic 007/62. I am suspicious of the readings of her machine.   She denies vision changes, headaches, muscle aches, weakness, dizziness, or confusion.  ROS noted in HPI.   Past Medical, Surgical, Social, and Family History Reviewed & Updated per EMR.   Pertinent Historical Findings include:   Social History   Tobacco Use  Smoking Status Never Smoker  Smokeless Tobacco Never Used   Objective: BP (!) 165/70   Pulse 67   SpO2 98%  Vitals and nursing notes reviewed  Physical Exam Gen: Alert and Oriented x 3, NAD HEENT: Normocephalic, atraumatic CV: RRR, no murmurs, normal S1, S2 split Resp: CTAB, no wheezing, rales, or rhonchi, comfortable work of breathing Ext: no clubbing, cyanosis, or edema Skin: warm, dry, intact, no rashes  Assessment/Plan:  Essential hypertension BP on recheck today in office remains above goal. Adding Amlodipine 5mg  daily to her 50mg  daily of Losartan both to be taken in the evening. - F/u in 2 months - If still elevated and BP home diary shows consistent elevation increase Losartan to 100mg  at next visit and repeat BMP   PATIENT EDUCATION PROVIDED: See AVS    Diagnosis and plan along with any newly prescribed medication(s) were discussed in detail with this patient today. The patient verbalized understanding and agreed with the plan. Patient advised  if symptoms worsen return to clinic or ER.   No orders of the defined types were placed in this encounter.   Meds ordered this encounter  Medications  . losartan (COZAAR) 50 MG tablet    Sig: Take 1 tablet (50 mg total) by mouth at bedtime.    Dispense:  30 tablet    Refill:  11  . amLODipine (NORVASC) 5 MG tablet    Sig: Take 1 tablet (5 mg total) by mouth daily.    Dispense:  90 tablet    Refill:  [place], DO 01/15/2019, 8:51 AM PGY-2 [place]

## 2019-01-15 NOTE — Assessment & Plan Note (Signed)
BP on recheck today in office remains above goal. Adding Amlodipine 5mg  daily to her 50mg  daily of Losartan both to be taken in the evening. - F/u in 2 months - If still elevated and BP home diary shows consistent elevation increase Losartan to 100mg  at next visit and repeat BMP

## 2019-01-21 ENCOUNTER — Ambulatory Visit (INDEPENDENT_AMBULATORY_CARE_PROVIDER_SITE_OTHER): Payer: Medicare Other | Admitting: *Deleted

## 2019-01-21 DIAGNOSIS — I639 Cerebral infarction, unspecified: Secondary | ICD-10-CM | POA: Diagnosis not present

## 2019-01-22 LAB — CUP PACEART REMOTE DEVICE CHECK
Date Time Interrogation Session: 20200625090940
Implantable Pulse Generator Implant Date: 20190627

## 2019-02-02 NOTE — Progress Notes (Signed)
Carelink Summary Report / Loop Recorder 

## 2019-02-23 ENCOUNTER — Ambulatory Visit (INDEPENDENT_AMBULATORY_CARE_PROVIDER_SITE_OTHER): Payer: Medicare Other | Admitting: *Deleted

## 2019-02-23 DIAGNOSIS — I639 Cerebral infarction, unspecified: Secondary | ICD-10-CM | POA: Diagnosis not present

## 2019-02-23 LAB — CUP PACEART REMOTE DEVICE CHECK
Date Time Interrogation Session: 20200727170210
Implantable Pulse Generator Implant Date: 20190627

## 2019-03-11 ENCOUNTER — Other Ambulatory Visit: Payer: Self-pay

## 2019-03-11 NOTE — Progress Notes (Signed)
Carelink Summary Report / Loop Recorder 

## 2019-03-17 ENCOUNTER — Ambulatory Visit: Payer: Medicare Other | Admitting: Family Medicine

## 2019-03-17 ENCOUNTER — Other Ambulatory Visit: Payer: Self-pay

## 2019-03-17 VITALS — BP 140/80 | HR 75

## 2019-03-17 DIAGNOSIS — I10 Essential (primary) hypertension: Secondary | ICD-10-CM

## 2019-03-17 NOTE — Progress Notes (Signed)
     Subjective: Chief Complaint  Patient presents with  . Hypertension   HPI: Chirstina Greenlee Robbins is a 73 y.o. presenting to clinic today to discuss the following:  HTN Follow Up At her last visit her BP was still not well controlled and elevated at her in office visit. I increased her Losartan to 50mg  daily and added amlodipine 5mg  daily both to be taken at night time. She brought her BP diary from home with SBP range from 102-138 and DBP range of 66-84. She states she has had no angioedema or ankle swelling.  She denies headaches, vision changes, dizziness, syncope, chest pain, SOB, abdominal pain, nausea, vomiting, or diarrhea.      ROS noted in HPI.   Past Medical, Surgical, Social, and Family History Reviewed & Updated per EMR.   Pertinent Historical Findings include:   Social History   Tobacco Use  Smoking Status Never Smoker  Smokeless Tobacco Never Used    Objective: BP 140/80   Pulse 75   SpO2 98%  Vitals and nursing notes reviewed  Physical Exam Gen: Alert and Oriented x 3, NAD CV: RRR, no murmurs, normal S1, S2 split Resp: CTAB, no wheezing, rales, or rhonchi, comfortable work of breathing Ext: no clubbing, cyanosis, or edema Skin: warm, dry, intact, no rashes  Assessment/Plan:  Essential hypertension BP in office today slightly elevated but home BP log shows good control after increasing Losartan to 50mg  and adding amlodipine 5mg .  - BMP today to recheck potassium and kidney function - Cont Losartan 50mg  daily - Cont Amlodipine 5mg  daily - F/u in 3 months  PATIENT EDUCATION PROVIDED: See AVS    Diagnosis and plan along with any newly prescribed medication(s) were discussed in detail with this patient today. The patient verbalized understanding and agreed with the plan. Patient advised if symptoms worsen return to clinic or ER.    Orders Placed This Encounter  Procedures  . Basic Metabolic Panel     Harolyn Rutherford, DO 03/17/2019, 8:38 AM PGY-3  [place]

## 2019-03-17 NOTE — Patient Instructions (Signed)
It was great to see you again today! Thank you for letting me participate in your care!  Today, we discussed your blood pressure which appears to be much better controlled on your current medication regimen. Please continue taking your medications as directed and continue taking a blood pressure diary at home. I will see you in 3 months to be sure everything is still doing well.  Be well, Harolyn Rutherford, DO PGY-3, [facility]

## 2019-03-17 NOTE — Assessment & Plan Note (Signed)
BP in office today slightly elevated but home BP log shows good control after increasing Losartan to 50mg  and adding amlodipine 5mg .  - BMP today to recheck potassium and kidney function - Cont Losartan 50mg  daily - Cont Amlodipine 5mg  daily - F/u in 3 months

## 2019-03-18 LAB — BASIC METABOLIC PANEL
BUN/Creatinine Ratio: 19 (ref 12–28)
BUN: 17 mg/dL (ref 8–27)
CO2: 25 mmol/L (ref 20–29)
Calcium: 9.7 mg/dL (ref 8.7–10.3)
Chloride: 102 mmol/L (ref 96–106)
Creatinine, Ser: 0.88 mg/dL (ref 0.57–1.00)
GFR calc Af Amer: 75 mL/min/{1.73_m2} (ref >59)
GFR calc non Af Amer: 65 mL/min/{1.73_m2} (ref >59)
Glucose: 87 mg/dL (ref 65–99)
Potassium: 4.1 mmol/L (ref 3.5–5.2)
Sodium: 141 mmol/L (ref 134–144)

## 2019-03-29 LAB — CUP PACEART REMOTE DEVICE CHECK
Date Time Interrogation Session: 20200830080749
Implantable Pulse Generator Implant Date: 20190627

## 2019-03-30 ENCOUNTER — Ambulatory Visit (INDEPENDENT_AMBULATORY_CARE_PROVIDER_SITE_OTHER): Payer: Medicare Other | Admitting: *Deleted

## 2019-03-30 DIAGNOSIS — I639 Cerebral infarction, unspecified: Secondary | ICD-10-CM | POA: Diagnosis not present

## 2019-04-08 NOTE — Progress Notes (Signed)
Carelink Summary Report / Loop Recorder 

## 2019-04-30 ENCOUNTER — Ambulatory Visit (INDEPENDENT_AMBULATORY_CARE_PROVIDER_SITE_OTHER): Payer: Medicare Other | Admitting: *Deleted

## 2019-04-30 DIAGNOSIS — I639 Cerebral infarction, unspecified: Secondary | ICD-10-CM | POA: Diagnosis not present

## 2019-05-02 LAB — CUP PACEART REMOTE DEVICE CHECK
Date Time Interrogation Session: 20201003205638
Implantable Pulse Generator Implant Date: 20190627

## 2019-05-07 NOTE — Progress Notes (Signed)
Carelink Summary Report / Loop Recorder 

## 2019-06-02 ENCOUNTER — Ambulatory Visit (INDEPENDENT_AMBULATORY_CARE_PROVIDER_SITE_OTHER): Payer: Medicare Other | Admitting: *Deleted

## 2019-06-02 DIAGNOSIS — I639 Cerebral infarction, unspecified: Secondary | ICD-10-CM | POA: Diagnosis not present

## 2019-06-03 LAB — CUP PACEART REMOTE DEVICE CHECK
Date Time Interrogation Session: 20201104071403
Implantable Pulse Generator Implant Date: 20190627

## 2019-06-17 ENCOUNTER — Ambulatory Visit: Payer: Medicare Other | Admitting: Family Medicine

## 2019-06-17 ENCOUNTER — Encounter: Payer: Self-pay | Admitting: Family Medicine

## 2019-06-17 ENCOUNTER — Other Ambulatory Visit: Payer: Self-pay

## 2019-06-17 VITALS — BP 137/80 | HR 87 | Wt 113.0 lb

## 2019-06-17 DIAGNOSIS — Z0000 Encounter for general adult medical examination without abnormal findings: Secondary | ICD-10-CM | POA: Diagnosis not present

## 2019-06-17 DIAGNOSIS — I10 Essential (primary) hypertension: Secondary | ICD-10-CM

## 2019-06-17 NOTE — Patient Instructions (Signed)
It was great to see you today! Thank you for letting me participate in your care!  Today, we discussed your blood pressure and I am glad it is well controlled. Please continue taking your blood pressure medications as prescribed.  Be well, Harolyn Rutherford, DO PGY-3, [facility]

## 2019-06-17 NOTE — Progress Notes (Signed)
     Subjective:  HPI: Heather Robbins is a 73 y.o. presenting to clinic today to discuss the following:  HTN BP well controlled according to home BP diary. Her systolic ranges from 0000000 to 144 highest with average in the high 120s. Her diastolic ranges from 67 to 88 with an average in the low 70s. She denies headaches, vision changes, chest pain, or shortness of breath.  She did have an isolated episode of dizzines after having a whiskey sour. She had it at night and was dizzy in the morning but it self resolved.   Health Maintenance: declines mammogram and colonoscopy, will get PNA vaccine ordered today, Hep C screen     ROS noted in HPI.    Social History   Tobacco Use  Smoking Status Never Smoker  Smokeless Tobacco Never Used    Objective: BP 137/80   Pulse 87   Wt 113 lb (51.3 kg)   SpO2 99%   BMI 18.80 kg/m  Vitals and nursing notes reviewed  Physical Exam Gen: Alert and Oriented x 3, NAD CV: RRR, no murmurs, normal S1, S2 split Resp: CTAB, no wheezing, rales, or rhonchi, comfortable work of breathings Ext: no clubbing, cyanosis, or edema Neuro: No gross deficits Skin: warm, dry, intact, no rashes  Assessment/Plan:  Essential hypertension BP well controlled on current regimen at home and on check today in office. No changes - Cont Losaratn 50mg  and Amlodipine 5mg  daily at night time   PATIENT EDUCATION PROVIDED: See AVS    Diagnosis and plan along with any newly prescribed medication(s) were discussed in detail with this patient today. The patient verbalized understanding and agreed with the plan. Patient advised if symptoms worsen return to clinic or ER.   Health Maintainance: Hep C screen performed today, PNA vaccine ordered   Orders Placed This Encounter  Procedures  . Pneumococcal polysaccharide vaccine 23-valent (for > 78 year old)    Standing Status:   Future    Standing Expiration Date:   07/17/2019  . Hepatitis c antibody (reflex)     Harolyn Rutherford, DO 06/17/2019, 9:18 AM PGY-3 [place]

## 2019-06-17 NOTE — Assessment & Plan Note (Signed)
BP well controlled on current regimen at home and on check today in office. No changes - Cont Losaratn 50mg  and Amlodipine 5mg  daily at night time

## 2019-06-18 LAB — HEPATITIS C ANTIBODY (REFLEX): HCV Ab: <0.1 {s_co_ratio} (ref 0.0–0.9)

## 2019-06-18 LAB — HCV COMMENT:

## 2019-06-22 NOTE — Progress Notes (Signed)
Carelink Summary Report / Loop Recorder 

## 2019-07-07 ENCOUNTER — Other Ambulatory Visit: Payer: Self-pay

## 2019-07-07 ENCOUNTER — Ambulatory Visit (INDEPENDENT_AMBULATORY_CARE_PROVIDER_SITE_OTHER): Payer: Medicare Other

## 2019-07-07 DIAGNOSIS — Z23 Encounter for immunization: Secondary | ICD-10-CM

## 2019-07-07 DIAGNOSIS — I10 Essential (primary) hypertension: Secondary | ICD-10-CM

## 2019-07-07 NOTE — Progress Notes (Signed)
Pt came in for Pneumovax vaccine. Injection given in Rt deltoid subq. Pt tolerated well. Ottis Stain, CMA

## 2019-08-27 ENCOUNTER — Other Ambulatory Visit: Payer: Self-pay | Admitting: Family Medicine

## 2019-08-27 DIAGNOSIS — I63512 Cerebral infarction due to unspecified occlusion or stenosis of left middle cerebral artery: Secondary | ICD-10-CM

## 2019-08-30 ENCOUNTER — Other Ambulatory Visit: Payer: Self-pay | Admitting: Family Medicine

## 2019-08-30 DIAGNOSIS — I63512 Cerebral infarction due to unspecified occlusion or stenosis of left middle cerebral artery: Secondary | ICD-10-CM

## 2019-10-10 IMAGING — CT CT HEAD W/O CM
4 series · 15 of 47 positions shown, 17 images · non-contrast
Comparison: None.

CLINICAL DATA: Right-sided weakness.

EXAM:
CT HEAD WITHOUT CONTRAST
TECHNIQUE: Contiguous axial images were obtained from the base of the skull
through the vertex without intravenous contrast.

[Series 3: head without · axial · non-contrast · 0.44mm/px · z∈[-149,-44]mm · 7 of 29 slices shown, 9 images]
[im 4/29  brain]
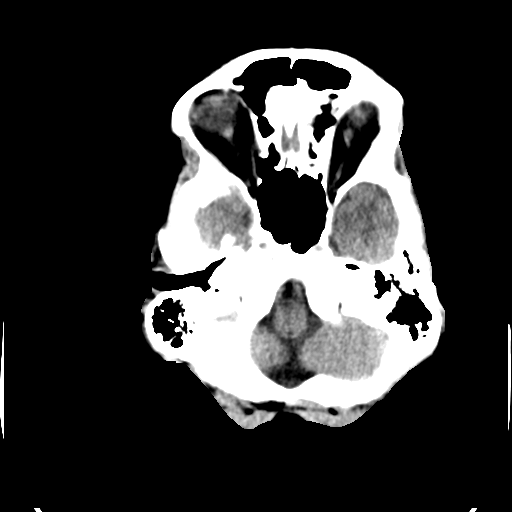
[im 4/29  bone]
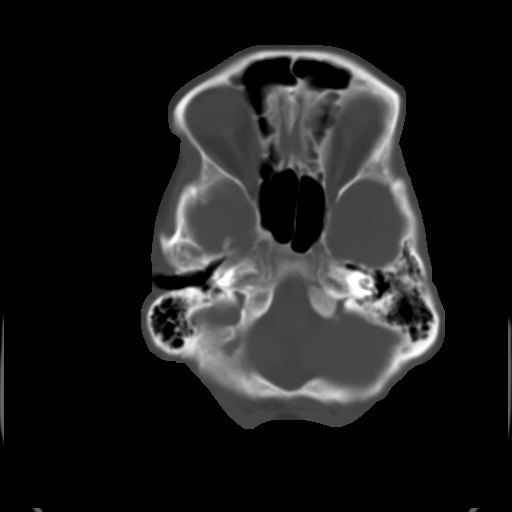
[im 8/29  brain]
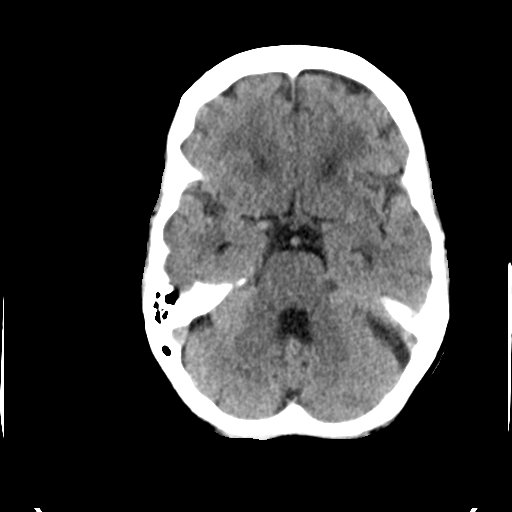
[im 11/29  brain]
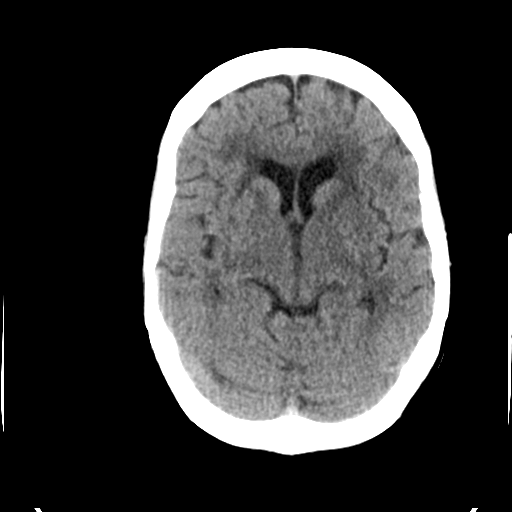
[im 15/29  brain]
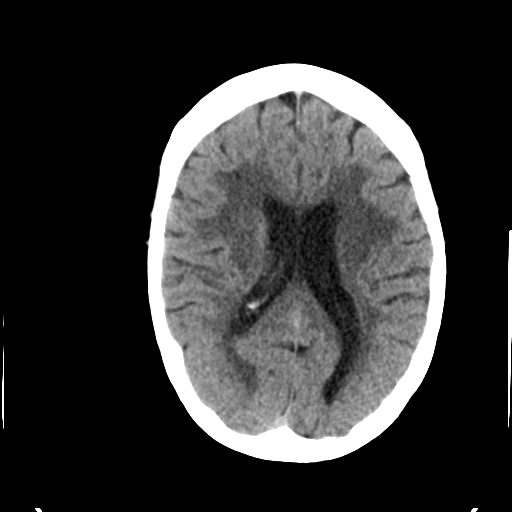
[im 18/29  brain]
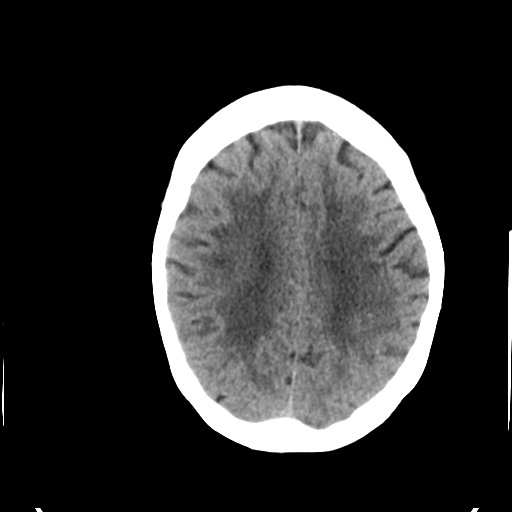
[im 18/29  bone]
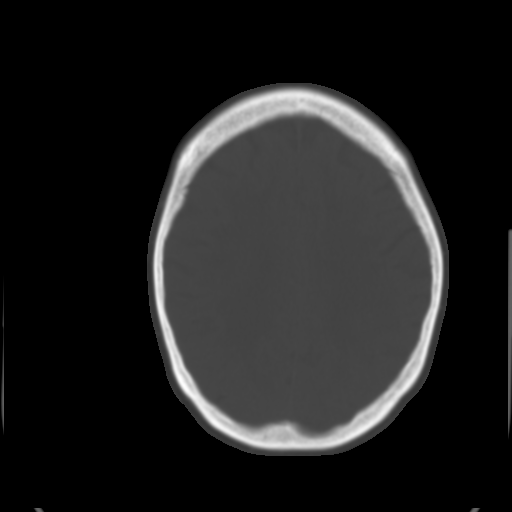
[im 22/29  brain]
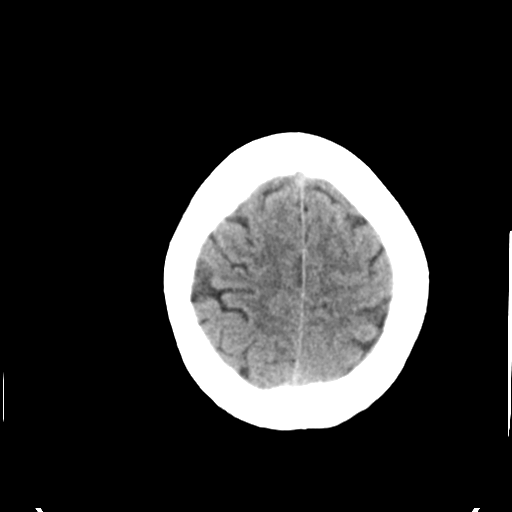
[im 25/29  brain]
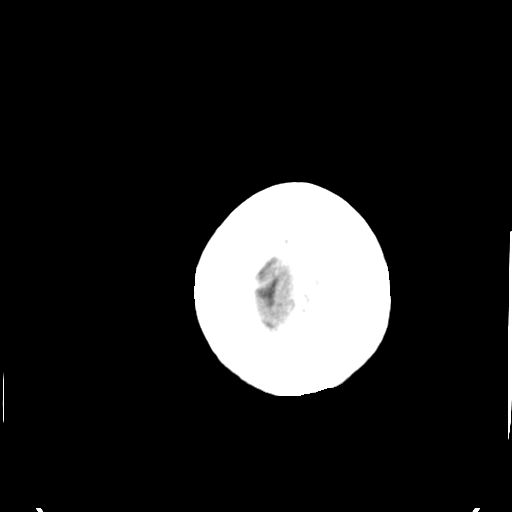

[Series 4: head bone · axial · 0.44mm/px · z∈[-150,-136]mm · 2 of 71 slices shown]
[im 8/71  bone]
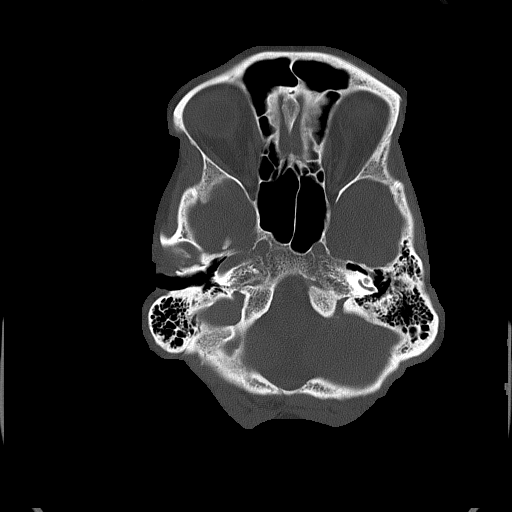
[im 15/71  bone]
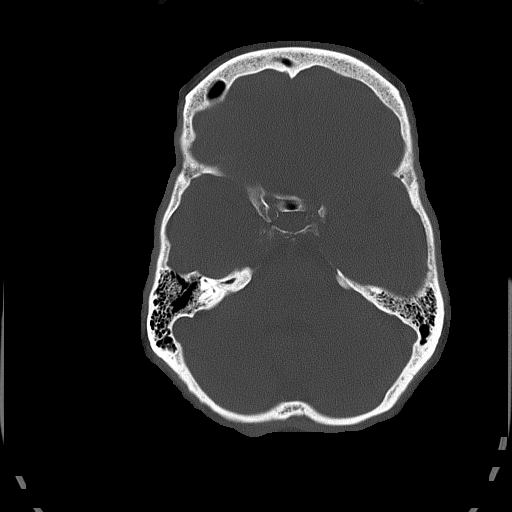

[Series 5: head without cor · coronal · non-contrast · 0.28mm/px · 3 of 67 slices shown]
[im 23/67  brain]
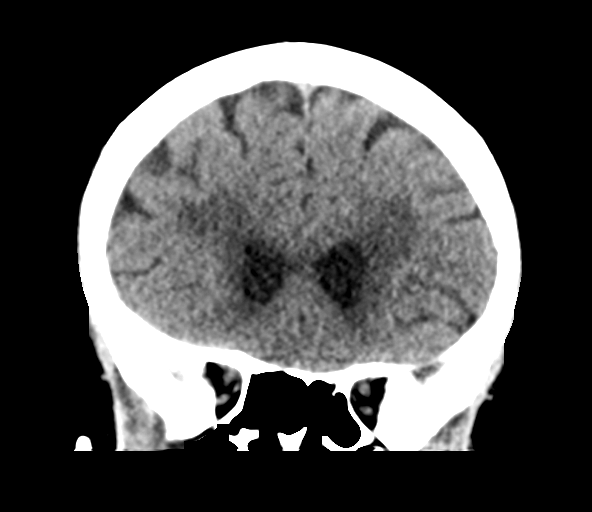
[im 30/67  brain]
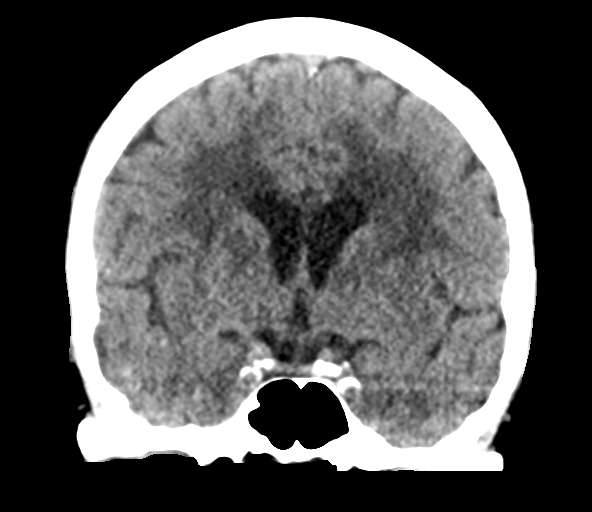
[im 37/67  brain]
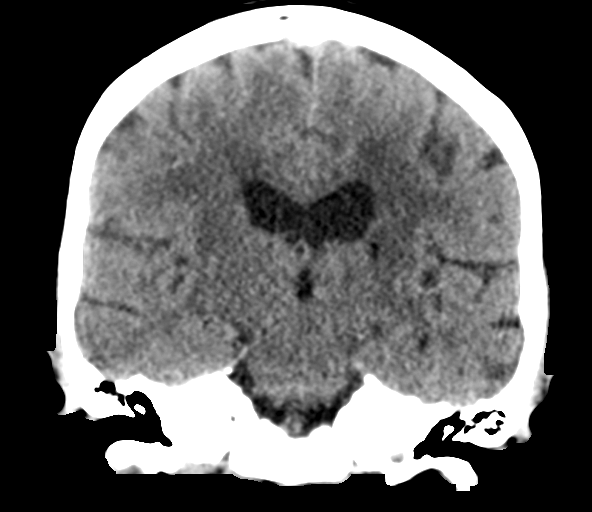

[Series 6: head without sag · sagittal · non-contrast · 0.27mm/px · 3 of 56 slices shown]
[im 19/56  brain]
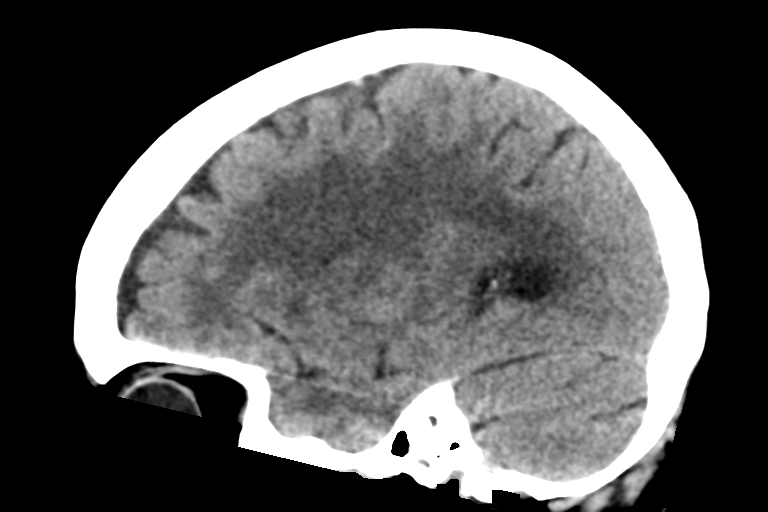
[im 28/56  brain]
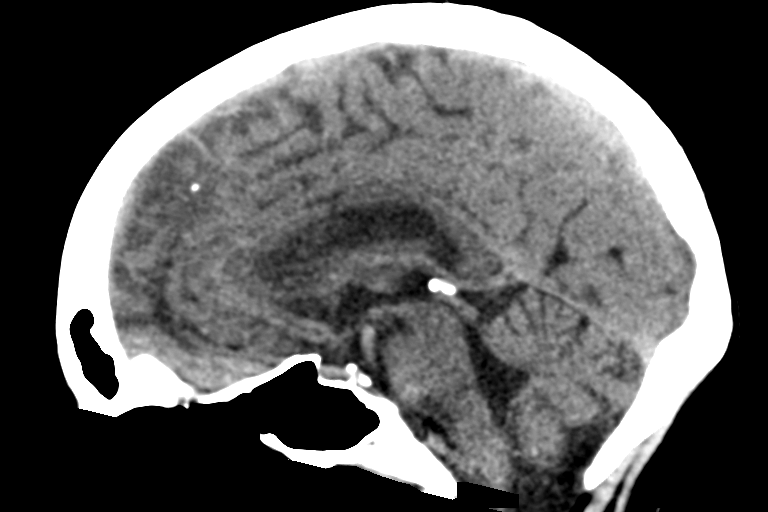
[im 37/56  brain]
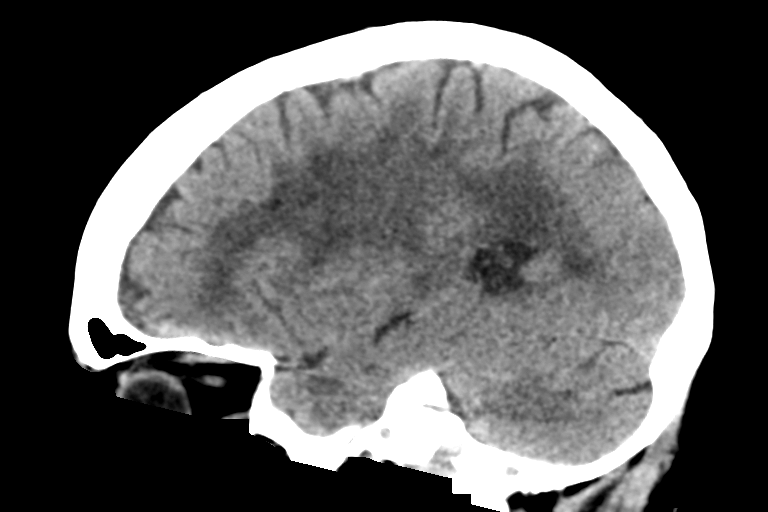

[15 of 47 positions shown; findings below may reference images not displayed]

FINDINGS: Brain: There is loss of the normal gray-white matter differentiation
within the left basal ganglia, with surrounding hypodensity likely
reflecting edema. No hemorrhage, hydrocephalus, extra-axial
collection or mass lesion/mass effect. Mild age related cerebral
atrophy. Extensive periventricular white matter hypodensity is
nonspecific, but likely reflect chronic microvascular ischemic
changes.

Vascular: Atherosclerotic vascular calcification of the carotid
siphons. No hyperdense vessel.

Skull: Normal. Negative for fracture or focal lesion.

Sinuses/Orbits: No acute finding.

Other: None.
IMPRESSION: 1. Findings concerning for acute infarct in the left basal ganglia.

These results were called by telephone at the time of interpretation
on 09/12/2017 at [DATE] to Dr. ADRIAN SYLWIA COGIEL , who verbally
acknowledged these results.

## 2019-11-16 ENCOUNTER — Telehealth: Payer: Self-pay | Admitting: Internal Medicine

## 2019-11-16 NOTE — Telephone Encounter (Signed)
Patient states she has a loop recorder and would like to have it removed.

## 2019-11-16 NOTE — Telephone Encounter (Signed)
Patient states when ILR was placed she was told she only had to keep it in for 1 year.  She states she was told it could last up to 3 but she only had to keep it a year, it has been longer than a year and patient is insistent that it come out.  Pt has unplugged the bedside monitor.  Recommedned she plug it back in to allow for data transmission, she stated she does not want to send any more transmissions she just wants the device out.    I explained to patient that the ILR has not detected anything that may have caused her stroke, the battery can last up to 3 years and it would be of interest to continue monitoring through the battery life, she still declined and request to have removed.

## 2019-12-23 ENCOUNTER — Encounter: Payer: Self-pay | Admitting: Internal Medicine

## 2019-12-23 ENCOUNTER — Other Ambulatory Visit: Payer: Self-pay

## 2019-12-23 ENCOUNTER — Ambulatory Visit: Payer: Medicare PPO | Admitting: Internal Medicine

## 2019-12-23 VITALS — BP 136/70 | HR 72 | Ht 65.0 in | Wt 117.8 lb

## 2019-12-23 DIAGNOSIS — I10 Essential (primary) hypertension: Secondary | ICD-10-CM

## 2019-12-23 DIAGNOSIS — I639 Cerebral infarction, unspecified: Secondary | ICD-10-CM | POA: Diagnosis not present

## 2019-12-23 DIAGNOSIS — E785 Hyperlipidemia, unspecified: Secondary | ICD-10-CM

## 2019-12-23 HISTORY — PX: LOOP RECORDER REMOVAL: EP1215

## 2019-12-23 NOTE — Patient Instructions (Signed)
Medication Instructions:  Your physician recommends that you continue on your current medications as directed. Please refer to the Current Medication list given to you today.  Labwork: None ordered.  Testing/Procedures: None ordered.  Follow-Up:  Your physician wants you to follow-up in: as needed with Dr. Allred.     Implantable Loop Recorder Placement, Care After Refer to this sheet in the next few weeks. These instructions provide you with information about caring for yourself after your procedure. Your health care provider may also give you more specific instructions. Your treatment has been planned according to current medical practices, but problems sometimes occur. Call your health care provider if you have any problems or questions after your procedure. What can I expect after the procedure? After the procedure, it is common to have:  Soreness or pain near the cut from surgery (incision).  Some swelling or bruising near the incision.  Follow these instructions at home:  Medicines  Take over-the-counter and prescription medicines only as told by your health care provider.  If you were prescribed an antibiotic medicine, take it as told by your health care provider. Do not stop taking the antibiotic even if you start to feel better.  Bathing Do not take baths, swim, or use a hot tub until your health care provider approves. You may shower 24 hours after removal of your monitor.  Incision care  Follow instructions from your health care provider about how to take care of your incision. Make sure you: ? Remove your top dressing after 24 hours (before you shower) ? Leave stitches (sutures), skin glue, or adhesive strips in place. These skin closures may need to stay in place for 2 weeks or longer. If adhesive strip edges start to loosen and curl up, you may trim the loose edges. Do not remove adhesive strips completely unless your health care provider tells you to do  that.  Check your incision area every day for signs of infection. Check for: ? More redness, swelling, or pain. ? Fluid or blood. ? Warmth. ? Pus or a bad smell.  Contact a health care provider if:  You have more redness, swelling, or pain around your incision.  You have more fluid or blood coming from your incision.  Your incision feels warm to the touch.  You have pus or a bad smell coming from your incision.  You have a fever.  You have pain that is not relieved by your pain medicine.  You have triggered your device because of fainting (syncope) or because of a heartbeat that feels like it is racing, slow, fluttering, or skipping (palpitations).  

## 2019-12-23 NOTE — Progress Notes (Signed)
PCP: Nuala Alpha, DO   Primary EP: Dr Rayann Heman  Heather Robbins is a 74 y.o. female who presents today for routine electrophysiology followup.  Since last being seen in our clinic, the patient reports doing reasonably well.  She is very paranoid about her ILR and wishes to have this taken out today.  She has multiple concerns about having the device in place. She is very anxious today.  Today, she denies symptoms of palpitations, chest pain, shortness of breath,  lower extremity edema, dizziness, presyncope, or syncope.  The patient is otherwise without complaint today.   Past Medical History:  Diagnosis Date  . Essential hypertension 12/12/2018  . HLD (hyperlipidemia)   . HTN (hypertension)   . Stroke [facility]) 08/2017   Past Surgical History:  Procedure Laterality Date  . CESAREAN SECTION    . LOOP RECORDER INSERTION N/A 01/23/2018   Procedure: LOOP RECORDER INSERTION;  Surgeon: [name], MD;  Location: [place] [facility];  Service: Cardiovascular;  Laterality: N/A;    ROS- all systems are reviewed and negatives except as per HPI above  Current Outpatient Medications  Medication Sig Dispense Refill  . acetaminophen (TYLENOL) 325 MG tablet Take 325 mg by mouth daily as needed for moderate pain or headache.    Marland Kitchen amLODipine (NORVASC) 5 MG tablet Take 1 tablet (5 mg total) by mouth daily. 90 tablet 3  . ASPIRIN LOW DOSE 81 MG EC tablet TAKE 1 TABLET BY MOUTH DAILY 90 tablet 3  . atorvastatin (LIPITOR) 80 MG tablet TAKE 1 TABLET BY MOUTH DAILY AT 6PM 90 tablet 3  . losartan (COZAAR) 50 MG tablet Take 1 tablet (50 mg total) by mouth at bedtime. 30 tablet 11   No current facility-administered medications for this visit.    Physical Exam: Vitals:   12/23/19 0849  BP: 136/70  Pulse: 72  SpO2: 98%  Weight: 117 lb 12.8 oz (53.4 kg)  Height: 5\' 5"  (1.651 m)    GEN- The patient is anxious appearing, alert and oriented x 3 today.   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  2. HTN Stable No change required today  3. HL Continue lipitor  Risks, benefits and potential toxicities for medications prescribed and/or refilled reviewed with patient today.   Return as needed  [name] MD, [facility] 12/23/2019 9:05 AM    ILR removal:  Informed written consent was obtained.  The patient required no sedation for the procedure today.   The patients left chest was therefore prepped and draped in the usual sterile fashion.  The skin overlying the ILR monitor was infiltrated with lidocaine for local analgesia.  A 0.5-cm incision was made over the site.  The previously implanted ILR was exposed and removed using a combination of sharp and blunt dissection.  Steri- Strips and a sterile dressing were then applied. EBL<47ml.  There were no early apparent complications.     CONCLUSIONS:   1. Successful explantation of a [employer] Reveal LINQ implantable loop recorder   2. No early apparent complications.        [name] MD, [facility] 12/23/2019 9:07 AM

## 2020-01-03 ENCOUNTER — Other Ambulatory Visit: Payer: Self-pay | Admitting: Family Medicine

## 2020-01-11 ENCOUNTER — Ambulatory Visit (INDEPENDENT_AMBULATORY_CARE_PROVIDER_SITE_OTHER): Payer: Medicare PPO | Admitting: Family Medicine

## 2020-01-11 ENCOUNTER — Other Ambulatory Visit: Payer: Self-pay

## 2020-01-11 VITALS — BP 145/75 | HR 75 | Ht 65.0 in | Wt 116.0 lb

## 2020-01-11 DIAGNOSIS — I10 Essential (primary) hypertension: Secondary | ICD-10-CM

## 2020-01-11 MED ORDER — AMLODIPINE BESYLATE 10 MG PO TABS: 10.0000 mg | ORAL_TABLET | Freq: Every day | ORAL | 2 refills | Status: DC

## 2020-01-11 NOTE — Progress Notes (Signed)
    SUBJECTIVE:   CHIEF COMPLAINT / HPI:    HTN Patient presents for follow up for HTN. She is doing well with no complaints today. She is compliant with her medications and has not missed doses. She does not know of any adverse effects of taking her medications. She overall feels well and denies any headaches, vision changes, chest pain, or dyspnea since her last visit. Home blood pressures over the last 3 months do show multiple days of BP systolic above 606 but none over 170. Her BP is slightly above goal at office check today.  PERTINENT  PMH / PSH: Cryptogenic Stroke, FH of MI  OBJECTIVE:   BP (!) 145/75   Pulse 75   Ht 5\' 5"  (1.651 m)   Wt 116 lb (52.6 kg)   SpO2 99%   BMI 19.30 kg/m   Gen: NAD Cardiac: RRR, no murmurs Resp: CTAB, no wheezing, no crackles Ext: No edema  ASSESSMENT/PLAN:   Essential hypertension Slightly above goal at office check today. Given history of stroke will aim for tighter control.  - Increase Amlodipine from 5mg  to 10mg  daily - Cont Losartaan 50mg  daily - BMP today due to being on Losartaan - F/u in 3 months     Nuala Alpha, [place]

## 2020-01-11 NOTE — Patient Instructions (Signed)
It was great to see you today! Thank you for letting me participate in your care!  Today, we discussed that your blood pressure is still slightly above our target goal. I am increasing your amlodipine to 10mg  daily at night time. You can finish your old medication by taking two 5mg  tablets daily until they are finished. Then please start taking the new 10mg  tablet daily.  Be well, Harolyn Rutherford, DO PGY-3, [facility]

## 2020-01-11 NOTE — Assessment & Plan Note (Addendum)
Slightly above goal at office check today. Given history of stroke will aim for tighter control.  - Increase Amlodipine from 5mg  to 10mg  daily - Cont Losartaan 50mg  daily - BMP today due to being on Losartaan - F/u in 3 months

## 2020-01-12 LAB — BASIC METABOLIC PANEL
BUN/Creatinine Ratio: 16 (ref 12–28)
BUN: 15 mg/dL (ref 8–27)
CO2: 25 mmol/L (ref 20–29)
Calcium: 9.5 mg/dL (ref 8.7–10.3)
Chloride: 101 mmol/L (ref 96–106)
Creatinine, Ser: 0.95 mg/dL (ref 0.57–1.00)
GFR calc Af Amer: 69 mL/min/{1.73_m2} (ref >59)
GFR calc non Af Amer: 60 mL/min/{1.73_m2} (ref >59)
Glucose: 87 mg/dL (ref 65–99)
Potassium: 4.2 mmol/L (ref 3.5–5.2)
Sodium: 140 mmol/L (ref 134–144)

## 2020-01-27 ENCOUNTER — Other Ambulatory Visit: Payer: Self-pay | Admitting: Family Medicine

## 2020-02-03 LAB — CUP PACEART INCLINIC DEVICE CHECK
Date Time Interrogation Session: 20210526094142
Implantable Pulse Generator Implant Date: 20190627

## 2020-03-15 ENCOUNTER — Telehealth: Payer: Self-pay

## 2020-03-15 NOTE — Telephone Encounter (Signed)
Patient LVM on nurse line reporting undesired side effects to Amlodipine 10mg  and is requesting to change back to 5mg . Patient reported increased fluid in ankles. I attempted to call her back to discuss and obtain more information, however had to LVM.

## 2020-03-16 NOTE — Telephone Encounter (Signed)
Patient scheduled to meet new PCP and discuss edema.

## 2020-03-21 NOTE — Progress Notes (Deleted)
    SUBJECTIVE:   CHIEF COMPLAINT / HPI:   Swelling in ankle ***  HTN Current regimen: Losartan 50 mg daily, amlodipine 10 mg daily increased from 5 mg on 6/14 Reports compliance*** BPs at home*** Denies chest pain, shortness of breath, leg edema aside from swelling noted above***    PERTINENT  PMH / PSH: History of cryptogenic stroke, hypertension, family history of MI  OBJECTIVE:   There were no vitals taken for this visit.   Physical Exam: *** General: 74 y.o. female in NAD Cardio: RRR no m/r/g Lungs: CTAB, no wheezing, no rhonchi, no crackles, no IWOB on *** Abdomen: Soft, non-tender to palpation, non-distended, positive bowel sounds Skin: warm and dry Extremities: No edema   ASSESSMENT/PLAN:   No problem-specific Assessment & Plan notes found for this encounter.     Cleophas Dunker, [place]

## 2020-03-22 ENCOUNTER — Ambulatory Visit: Payer: Medicare PPO | Admitting: Family Medicine

## 2020-03-22 ENCOUNTER — Telehealth: Payer: Self-pay | Admitting: Internal Medicine

## 2020-03-22 NOTE — Telephone Encounter (Signed)
Any recommendations on a PCP?

## 2020-03-22 NOTE — Telephone Encounter (Signed)
Patient is looking for a new PCP. She states she was seeing Dr. Garlan Fillers but he is no longer with the group. She was switched to Dr. Sandi Carne but she went home sick and her appt was rescheduled to Thursday with another provider. She states this keeps happening to her and wants to know if Dr. Rayann Heman can refer her to a PCP that is more stationary.

## 2020-03-24 ENCOUNTER — Ambulatory Visit (INDEPENDENT_AMBULATORY_CARE_PROVIDER_SITE_OTHER): Payer: Medicare PPO | Admitting: Family Medicine

## 2020-03-24 ENCOUNTER — Other Ambulatory Visit: Payer: Self-pay

## 2020-03-24 ENCOUNTER — Encounter: Payer: Self-pay | Admitting: Family Medicine

## 2020-03-24 VITALS — BP 136/62 | HR 73 | Ht 65.0 in | Wt 117.2 lb

## 2020-03-24 DIAGNOSIS — M25471 Effusion, right ankle: Secondary | ICD-10-CM | POA: Diagnosis not present

## 2020-03-24 DIAGNOSIS — I10 Essential (primary) hypertension: Secondary | ICD-10-CM | POA: Diagnosis not present

## 2020-03-24 DIAGNOSIS — M25472 Effusion, left ankle: Secondary | ICD-10-CM | POA: Diagnosis not present

## 2020-03-24 HISTORY — DX: Effusion, left ankle: M25.471

## 2020-03-24 HISTORY — DX: Effusion, left ankle: M25.472

## 2020-03-24 MED ORDER — AMLODIPINE BESYLATE 5 MG PO TABS: 5.0000 mg | ORAL_TABLET | Freq: Every day | ORAL | 3 refills | Status: DC

## 2020-03-24 NOTE — Progress Notes (Signed)
Heather Robbins is alone Sources of clinical information for visit is/are patient and past medical records. Nursing assessment for this office visit was reviewed with the patient for accuracy and revision.     Previous Report(s) Reviewed: historical medical records, lab reports and echocardiogram report from 2019 hospital admission for stroke  Depression screen [facility] 2/9 03/24/2020  Decreased Interest 0  Down, Depressed, Hopeless 0  PHQ - 2 Score 0  Altered sleeping 0  Tired, decreased energy 0  Change in appetite 0  Feeling bad or failure about yourself  0  Trouble concentrating 0  Moving slowly or fidgety/restless 0  Suicidal thoughts 0  PHQ-9 Score 0    Fall Risk  12/11/2018 11/04/2017  Falls in the past year? 0 No  Number falls in past yr: 0 -  Follow up Falls evaluation completed -    PHQ9 SCORE ONLY 03/24/2020 01/11/2020 12/11/2018  PHQ-9 Total Score 0 0 0    Adult vaccines due  Topic Date Due   TETANUS/TDAP  Never done    Health Maintenance Due  Topic Date Due   TETANUS/TDAP  Never done   DEXA SCAN  Never done   INFLUENZA VACCINE  02/28/2020      History/P.E. limitations: none  Adult vaccines due  Topic Date Due   TETANUS/TDAP  Never done   There are no preventive care reminders to display for this patient.  Health Maintenance Due  Topic Date Due   TETANUS/TDAP  Never done   DEXA SCAN  Never done   INFLUENZA VACCINE  02/28/2020     Chief Complaint  Patient presents with   Joint Swelling

## 2020-03-24 NOTE — Assessment & Plan Note (Signed)
Established problem Daily home BP readings in SBP 105 to 130 range, mostly in 110 to 120 mmHg range.   No syncople, dizziness with standing, no shortness of breath Plays pickle ball for 1 hour at a time, multiple times a week.  Hx of crytogenic stoke   Amlodipine increased from 5 mg to 10 mg daily by Dr Garlan Fillers 01/11/20 for better BP contol.  Heather Robbins developed bilateral ankle edema that is most obvious towards the end of the day.  Physical exam VS reviewed.  No lower extremity edema  A/P Hypertension  Adequate control by home BP measures. Heather Robbins reports being anxious when she comes to doctor.  There may be a component of white coat hypertension.  Plan a trial of returning dose of Amlodipine to 5 mg daily with monitoring home BP measures.  RTC 4 weeks to assess BP control and presence of ankle edema with PCP.   I would emphasize Heather Robbins' home BP measure series over the one-time office measure in judging the efficacy of her BP control.

## 2020-03-24 NOTE — Patient Instructions (Signed)
I do believe that the swelling in your ankles is coming from the higher dose of Amlodipine (10 mg). Your home blood pressure readings look really good.  You likely could have a lower dose of Amlodipine and still have good blood pressure control.   Let's try a trial of amlodipine at 5 mg daily, keep a record of your blood pressure like you are doing, and lets see if the ankle swelling goes away.    We will set up a follow up appointment with Dr Sandi Carne to see how your home blood pressures and ankle swelling are doing on this lower dose.

## 2020-03-25 ENCOUNTER — Encounter: Payer: Self-pay | Admitting: Family Medicine

## 2020-03-30 ENCOUNTER — Other Ambulatory Visit: Payer: Self-pay | Admitting: Family Medicine

## 2020-04-06 NOTE — Telephone Encounter (Signed)
Left message with information for [facility].

## 2020-04-11 ENCOUNTER — Encounter: Payer: Self-pay | Admitting: Family Medicine

## 2020-04-11 ENCOUNTER — Other Ambulatory Visit: Payer: Self-pay

## 2020-04-11 ENCOUNTER — Ambulatory Visit (INDEPENDENT_AMBULATORY_CARE_PROVIDER_SITE_OTHER): Payer: Medicare PPO | Admitting: Family Medicine

## 2020-04-11 VITALS — BP 150/70 | HR 78 | Ht 65.0 in | Wt 119.0 lb

## 2020-04-11 DIAGNOSIS — I10 Essential (primary) hypertension: Secondary | ICD-10-CM | POA: Diagnosis not present

## 2020-04-11 DIAGNOSIS — E785 Hyperlipidemia, unspecified: Secondary | ICD-10-CM | POA: Diagnosis not present

## 2020-04-11 DIAGNOSIS — Z13820 Encounter for screening for osteoporosis: Secondary | ICD-10-CM | POA: Diagnosis not present

## 2020-04-11 NOTE — Assessment & Plan Note (Signed)
BP appears well controlled on her home log, there have been reports of patient having whitecoat hypertension previously and does have elevation today.  Given that her log has been good, will continue with current dose of losartan 50 mg daily and Norvasc 5 mg daily.  She has had improvement in her swelling since decreasing amlodipine, therefore we will keep her at current dose.  We will have her come back in 3 months or sooner as needed to check again.  Patient advised to continue to check her blood pressure and to contact us if consistently elevated.

## 2020-04-11 NOTE — Progress Notes (Signed)
    SUBJECTIVE:   CHIEF COMPLAINT / HPI:   Hypertension Current regimen: Norvasc 5mg , Losartan 50mg  Patient seen on 8/26 by Dr. Wendy Poet At that time noted to have increased ankle edema after increasing Norvasc from 5 to 10 mg in June for better blood pressure control She was advised to decrease Norvasc to 5 mg daily Swelling has improved since decreasing to 10mg  BP at home: see image below Denies chest pain, shortness of breath Last BMP on 01/11/2020, WNL      History of stroke/HLD On Lipitor 80 mg and ASA 81 mg Last lipid panel 11/2018, LDL 22 Follows with cardiology and electrophysiology Have loop recorder removed in May 2021 after 2 years of monitoring without A. fib detection  Has not yet received her Covid vaccine and is not sure that she wants to at this time, but thinks that she may in the future Declines mammogram She is due for DEXA scan  PERTINENT  PMH / PSH: HTN, HLD, history of stroke  OBJECTIVE:   BP (!) 150/70   Pulse 78   Ht 5\' 5"  (1.651 m)   Wt 119 lb (54 kg)   SpO2 98%   BMI 19.80 kg/m    Physical Exam:  General: 74 y.o. female in NAD Cardio: RRR no m/r/g Lungs: CTAB, no wheezing, no rhonchi, no crackles, no IWOB on RA Skin: warm and dry Extremities: No edema   ASSESSMENT/PLAN:   Essential hypertension BP appears well controlled on her home log, there have been reports of patient having whitecoat hypertension previously and does have elevation today.  Given that her log has been good, will continue with current dose of losartan 50 mg daily and Norvasc 5 mg daily.  She has had improvement in her swelling since decreasing amlodipine, therefore we will keep her at current dose.  We will have her come back in 3 months or sooner as needed to check again.  Patient advised to continue to check her blood pressure and to contact us if consistently elevated.  Hyperlipidemia Last BMP in May 2020.  Currently on Lipitor 80 mg daily.  Has a history of a  stroke, is also on aspirin 81 mg.  Will check lipid panel today.  Screening for osteoporosis Discussed with patient need for DEXA scan and importance of weightbearing exercises.  She was given information on calling to schedule DEXA scan.  Patient was inquiring if she would need to undress in order for this to occur, advised that she asked when she called to schedule.   Patient was educated on the importance of Covid vaccination and offered to answer any questions that she may have.  She continues to decline at this time.  We will continue to readdress.  Cleophas Dunker, [place]

## 2020-04-11 NOTE — Patient Instructions (Signed)
Thank you for coming to see me today. It was a pleasure. Today we talked about:   Your blood pressure log looks great.  We won't make any changes right now.  We will get some labs today.  If they are abnormal or we need to do something about them, I will call you.  If they are normal, I will send you a message on MyChart (if it is active) or a letter in the mail.  If you don't hear from [place] in 2 weeks, please call the office at the number below.  I have placed an order for a DEXA scan, which will measure your bone density.  Please call  Imaging within 1 week at [phone] to schedule your appointment.  We will let you know your results afterwards.   Please follow-up with me in 3 months.  If you have any questions or concerns, please do not hesitate to call the office at [phone].  Best,   [place] Constable, DO

## 2020-04-11 NOTE — Assessment & Plan Note (Signed)
Last BMP in May 2020.  Currently on Lipitor 80 mg daily.  Has a history of a stroke, is also on aspirin 81 mg.  Will check lipid panel today.

## 2020-04-11 NOTE — Assessment & Plan Note (Signed)
Discussed with patient need for DEXA scan and importance of weightbearing exercises.  She was given information on calling to schedule DEXA scan.  Patient was inquiring if she would need to undress in order for this to occur, advised that she asked when she called to schedule.

## 2020-04-12 ENCOUNTER — Other Ambulatory Visit: Payer: Self-pay | Admitting: Family Medicine

## 2020-04-12 DIAGNOSIS — E785 Hyperlipidemia, unspecified: Secondary | ICD-10-CM

## 2020-04-12 LAB — LIPID PANEL
Chol/HDL Ratio: 3.5 ratio (ref 0.0–4.4)
Cholesterol, Total: 137 mg/dL (ref 100–199)
HDL: 39 mg/dL — ABNORMAL LOW (ref >39)
LDL Chol Calc (NIH): 76 mg/dL (ref 0–99)
Triglycerides: 125 mg/dL (ref 0–149)
VLDL Cholesterol Cal: 22 mg/dL (ref 5–40)

## 2020-04-12 MED ORDER — EZETIMIBE 10 MG PO TABS: 10.0000 mg | ORAL_TABLET | Freq: Every day | ORAL | 1 refills | Status: DC

## 2020-04-12 NOTE — Progress Notes (Signed)
Zetia to achieve LDL <70

## 2020-07-27 ENCOUNTER — Ambulatory Visit: Payer: Medicare PPO | Admitting: Family Medicine

## 2020-07-27 ENCOUNTER — Telehealth: Payer: Medicare PPO | Admitting: Family Medicine

## 2020-08-09 ENCOUNTER — Ambulatory Visit: Payer: Medicare PPO | Admitting: Dermatology

## 2020-08-09 ENCOUNTER — Other Ambulatory Visit: Payer: Self-pay

## 2020-08-09 ENCOUNTER — Encounter: Payer: Self-pay | Admitting: Dermatology

## 2020-08-09 DIAGNOSIS — D485 Neoplasm of uncertain behavior of skin: Secondary | ICD-10-CM

## 2020-08-09 DIAGNOSIS — L821 Other seborrheic keratosis: Secondary | ICD-10-CM | POA: Diagnosis not present

## 2020-08-09 DIAGNOSIS — D1800 Hemangioma unspecified site: Secondary | ICD-10-CM | POA: Diagnosis not present

## 2020-08-09 DIAGNOSIS — L720 Epidermal cyst: Secondary | ICD-10-CM

## 2020-08-09 DIAGNOSIS — Z1283 Encounter for screening for malignant neoplasm of skin: Secondary | ICD-10-CM | POA: Diagnosis not present

## 2020-08-09 DIAGNOSIS — L570 Actinic keratosis: Secondary | ICD-10-CM | POA: Diagnosis not present

## 2020-08-09 DIAGNOSIS — L820 Inflamed seborrheic keratosis: Secondary | ICD-10-CM | POA: Diagnosis not present

## 2020-08-09 NOTE — Patient Instructions (Signed)

## 2020-08-10 ENCOUNTER — Encounter: Payer: Self-pay | Admitting: Dermatology

## 2020-08-10 NOTE — Progress Notes (Signed)
   Follow-Up Visit   Subjective  Heather Robbins is a 75 y.o. female who presents for the following: Skin Problem (New places on face that pt wants checked).  Annual skin examination Location: New lesion forehead has enlarged Duration:  Quality:  Associated Signs/Symptoms: Modifying Factors:  Severity:  Timing: Context:   Objective  Well appearing patient in no apparent distress; mood and affect are within normal limits. Objective  Head - Anterior (Face): Sun exposed areas and back examined: No atypical moles or melanoma.  Objective  Right Lower Back: Multiple red raised 1-43mm papules  Objective  face & back: Multiple 2 to 8 mm flattopped brown keratotic papules  Objective  Dorsum of Nose, Mid Supratip of Nose, Right Buccal Cheek: Erythematous patches with gritty scale.  Objective  Mid Forehead: Hornlike 5 mm pink crust  Objective  Left Zygomatic Area: Solitary, smooth skin colored to translucent papule.     All sun exposed areas plus back examined.   Assessment & Plan    Encounter for screening for malignant neoplasm of skin Head - Anterior (Face)  Yearly skin check plus encouraged to self examine twice yearly.  Hemangioma, unspecified site Right Lower Back  Okay to leave if stable  Seborrheic keratosis face & back  Observe- okay to leave if stable  AK (actinic keratosis) (3) Dorsum of Nose; Mid Supratip of Nose; Right Buccal Cheek  Destruction of lesion - Dorsum of Nose, Mid Supratip of Nose, Right Buccal Cheek Complexity: simple   Destruction method: cryotherapy   Informed consent: discussed and consent obtained   Timeout:  patient name, date of birth, surgical site, and procedure verified Lesion destroyed using liquid nitrogen: Yes   Cryotherapy cycles:  3 Outcome: patient tolerated procedure well with no complications    Neoplasm of uncertain behavior of skin Mid Forehead  Skin / nail biopsy Type of biopsy: tangential    Informed consent: discussed and consent obtained   Timeout: patient name, date of birth, surgical site, and procedure verified   Procedure prep:  Patient was prepped and draped in usual sterile fashion (Non sterile) Prep type:  Chlorhexidine Anesthesia: the lesion was anesthetized in a standard fashion   Anesthetic:  1% lidocaine w/ epinephrine 1-100,000 local infiltration Instrument used: flexible razor blade   Outcome: patient tolerated procedure well   Post-procedure details: wound care instructions given    Destruction of lesion Complexity: simple   Destruction method: electrodesiccation and curettage   Informed consent: discussed and consent obtained   Timeout:  patient name, date of birth, surgical site, and procedure verified Anesthesia: the lesion was anesthetized in a standard fashion   Anesthetic:  1% lidocaine w/ epinephrine 1-100,000 local infiltration Curettage performed in three different directions: Yes   Curettage cycles:  3 Margin per side (cm):  0.1 Final wound size (cm):  0.9 Hemostasis achieved with:  aluminum chloride Outcome: patient tolerated procedure well with no complications   Post-procedure details: wound care instructions given    Specimen 1 - Surgical pathology Differential Diagnosis: r/o sk Check Margins: No  Epidermal cyst Left Zygomatic Area  Okay to leave if stable      I, Heather Monarch, MD, have reviewed all documentation for this visit.  The documentation on 08/10/20 for the exam, diagnosis, procedures, and orders are all accurate and complete.

## 2020-08-20 ENCOUNTER — Other Ambulatory Visit: Payer: Self-pay | Admitting: Family Medicine

## 2020-08-20 DIAGNOSIS — I63512 Cerebral infarction due to unspecified occlusion or stenosis of left middle cerebral artery: Secondary | ICD-10-CM

## 2020-08-22 MED ORDER — ASPIRIN 81 MG PO TBEC: 81.0000 mg | DELAYED_RELEASE_TABLET | Freq: Every day | ORAL | 3 refills | Status: DC

## 2020-08-22 NOTE — Addendum Note (Signed)
Addended by: Cleophas Dunker on: 08/22/2020 08:40 AM   Modules accepted: Orders

## 2020-08-22 NOTE — Telephone Encounter (Signed)
Patient calls nurse line regarding rx refills. Called pharmacy due to issues with electronic system.   Spoke with Tye [place], verified that pharmacy did receive rx and would fill for patient.   Talbot Grumbling, RN

## 2020-10-07 ENCOUNTER — Other Ambulatory Visit: Payer: Self-pay | Admitting: Family Medicine

## 2020-10-07 DIAGNOSIS — I10 Essential (primary) hypertension: Secondary | ICD-10-CM

## 2020-10-07 NOTE — Telephone Encounter (Signed)
Needs appointment

## 2020-10-17 NOTE — Progress Notes (Signed)
    SUBJECTIVE:   CHIEF COMPLAINT / HPI:   HTN Current regimen: Losartan 50 mg daily, Norvasc 5 mg daily Last BMP 01/11/2020, creatinine 0.95 at that time Checks BP at home daily, SBP 116-140 Continues to have stable leg swelling since decreasing Norvasc  Denies chest pain, shortness of breath  History of stroke/hyperlipidemia Current regimen: Lipitor 80 mg daily, aspirin 81 mg daily Last lipid panel 04/11/2020, LDL 76 at that time Patient was advised to start Zetia on 04/12/2020 after LDL was above goal She did not do this as she doesn't want to be on more medication that needed  DEXA scan needed Colonoscopy needed, patient declines, might consider cologuard Mammogram declines Covid vaccine declines  PERTINENT  PMH / PSH: HTN, history of stroke, hyperlipidemia  OBJECTIVE:   BP (!) 148/67   Pulse 70   Ht 5\' 5"  (1.651 m)   Wt 123 lb 12.8 oz (56.2 kg)   SpO2 99%   BMI 20.60 kg/m    Physical Exam:  General: 75 y.o. female in NAD Cardio: RRR no m/r/g Lungs: CTAB, no wheezing, no rhonchi, no crackles, no IWOB on RA Skin: warm and dry Extremities: No edema   ASSESSMENT/PLAN:   Essential hypertension BP very well controlled at home.  Recommend continue to monitor at home.  Continue with her current regimen of amlodipine 5 mg and losartan 50 mg daily.  Check a BMP today.  Hyperlipidemia LDL goal less than 70 given her history of stroke.  She is currently on Lipitor 80 mg daily and aspirin 81 mg daily.  She has not started Zetia.  Discussed that we will check direct LDL today and if still elevated greater than 70, would recommend initiating Zetia.  She is understanding of this.   Patient declines Covid vaccination today She declines mammogram She declines colonoscopy, encourage patient to at least get a Cologuard, she will consider this Discussed importance of osteoporosis screening especially in a patient who is thin, she reports understanding and will consider calling.   An order was placed at the last visit, she was given the number to schedule this.  Also encouraged weightbearing exercise and adequate calcium intake throughout the day.  Cleophas Dunker, [place]

## 2020-10-19 ENCOUNTER — Ambulatory Visit (INDEPENDENT_AMBULATORY_CARE_PROVIDER_SITE_OTHER): Payer: Medicare PPO | Admitting: Family Medicine

## 2020-10-19 ENCOUNTER — Encounter: Payer: Self-pay | Admitting: Family Medicine

## 2020-10-19 ENCOUNTER — Other Ambulatory Visit: Payer: Self-pay

## 2020-10-19 VITALS — BP 148/67 | HR 70 | Ht 65.0 in | Wt 123.8 lb

## 2020-10-19 DIAGNOSIS — I10 Essential (primary) hypertension: Secondary | ICD-10-CM | POA: Diagnosis not present

## 2020-10-19 DIAGNOSIS — E785 Hyperlipidemia, unspecified: Secondary | ICD-10-CM

## 2020-10-19 NOTE — Patient Instructions (Addendum)
Thank you for coming to see me today. It was a pleasure. Today we talked about:   We will get some labs today.  If they are abnormal or we need to do something about them, I will call you.  If they are normal, I will send you a message on MyChart (if it is active) or a letter in the mail.  If you don't hear from [place] in 2 weeks, please call the office at the number below.  I have placed an order for your bone density scan.  Please call [facility] at [phone] to schedule your appointment within one week.    Do weight bearing exercises like we talked about and make sure you get enough calcium.  Think about the Cologuard.  Please follow-up with PCP in 6 months.  If you have any questions or concerns, please do not hesitate to call the office at [phone].  Best,   [place] Constable, DO

## 2020-10-19 NOTE — Assessment & Plan Note (Signed)
LDL goal less than 70 given her history of stroke.  She is currently on Lipitor 80 mg daily and aspirin 81 mg daily.  She has not started Zetia.  Discussed that we will check direct LDL today and if still elevated greater than 70, would recommend initiating Zetia.  She is understanding of this.

## 2020-10-19 NOTE — Assessment & Plan Note (Signed)
BP very well controlled at home.  Recommend continue to monitor at home.  Continue with her current regimen of amlodipine 5 mg and losartan 50 mg daily.  Check a BMP today.

## 2020-10-20 ENCOUNTER — Other Ambulatory Visit: Payer: Self-pay | Admitting: Family Medicine

## 2020-10-20 DIAGNOSIS — E785 Hyperlipidemia, unspecified: Secondary | ICD-10-CM

## 2020-10-20 LAB — BASIC METABOLIC PANEL
BUN/Creatinine Ratio: 19 (ref 12–28)
BUN: 16 mg/dL (ref 8–27)
CO2: 22 mmol/L (ref 20–29)
Calcium: 9.3 mg/dL (ref 8.7–10.3)
Chloride: 104 mmol/L (ref 96–106)
Creatinine, Ser: 0.85 mg/dL (ref 0.57–1.00)
Glucose: 81 mg/dL (ref 65–99)
Potassium: 4.4 mmol/L (ref 3.5–5.2)
Sodium: 142 mmol/L (ref 134–144)
eGFR: 72 mL/min/{1.73_m2} (ref >59)

## 2020-10-20 LAB — LDL CHOLESTEROL, DIRECT: LDL Direct: 86 mg/dL (ref 0–99)

## 2020-10-20 MED ORDER — EZETIMIBE 10 MG PO TABS: 10.0000 mg | ORAL_TABLET | Freq: Every day | ORAL | 1 refills | Status: DC

## 2021-01-04 ENCOUNTER — Other Ambulatory Visit: Payer: Self-pay | Admitting: Family Medicine

## 2021-01-04 DIAGNOSIS — I10 Essential (primary) hypertension: Secondary | ICD-10-CM

## 2021-01-10 ENCOUNTER — Other Ambulatory Visit: Payer: Self-pay

## 2021-01-10 MED ORDER — LOSARTAN POTASSIUM 50 MG PO TABS: 1.0000 | ORAL_TABLET | Freq: Every day | ORAL | 3 refills | Status: DC

## 2021-04-02 ENCOUNTER — Other Ambulatory Visit: Payer: Self-pay | Admitting: Family Medicine

## 2021-04-02 DIAGNOSIS — E785 Hyperlipidemia, unspecified: Secondary | ICD-10-CM

## 2021-04-06 ENCOUNTER — Ambulatory Visit (INDEPENDENT_AMBULATORY_CARE_PROVIDER_SITE_OTHER): Payer: Medicare PPO | Admitting: Student

## 2021-04-06 ENCOUNTER — Other Ambulatory Visit: Payer: Self-pay

## 2021-04-06 DIAGNOSIS — E785 Hyperlipidemia, unspecified: Secondary | ICD-10-CM

## 2021-04-06 DIAGNOSIS — I10 Essential (primary) hypertension: Secondary | ICD-10-CM

## 2021-04-06 DIAGNOSIS — I63512 Cerebral infarction due to unspecified occlusion or stenosis of left middle cerebral artery: Secondary | ICD-10-CM | POA: Diagnosis not present

## 2021-04-06 MED ORDER — LOSARTAN POTASSIUM 50 MG PO TABS: 50.0000 mg | ORAL_TABLET | Freq: Every day | ORAL | 3 refills | Status: DC

## 2021-04-06 MED ORDER — EZETIMIBE 10 MG PO TABS
10.0000 mg | ORAL_TABLET | Freq: Every day | ORAL | 1 refills | Status: DC
Start: 2021-04-06 — End: 2021-10-02

## 2021-04-06 MED ORDER — ATORVASTATIN CALCIUM 80 MG PO TABS: ORAL_TABLET | ORAL | 3 refills | Status: DC

## 2021-04-06 MED ORDER — AMLODIPINE BESYLATE 5 MG PO TABS: 5.0000 mg | ORAL_TABLET | Freq: Every day | ORAL | 2 refills | Status: DC

## 2021-04-06 NOTE — Patient Instructions (Signed)
It was wonderful to meet you today. Thank you for allowing me to be a part of your care. Below is a short summary of what we discussed at your visit today:  You are overall health with no concerns at this time.   You are tolerating your medication well. Refilled your medications  Discusses Covid, Flu and Shingles vaccine being over due but you declined them.   If you have any questions or concerns, please do not hesitate to contact us via phone or MyChart message.   Alen Bleacher, MD [facility]

## 2021-04-06 NOTE — Progress Notes (Signed)
    SUBJECTIVE:   CHIEF COMPLAINT / HPI:   Heather Robbins is a 75 yo female with past medical hx of stroke, HTN & HLD presents today for medication refill. Patient said she is doing well overall and has questions about how Amlodipine works. She takes amlodipine for hypertension and is concerned if it can cause rupture of her blood vessel since it stretches the vein. She endorse some mild swelling of the lower extremities a little over a year ago. Her BP diary shows reading mostly in the 120s/70s. Patient denies any recent LE swelling, nausea, dizziness or falls.   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  Eyes:  Negative for double vision, photophobia and pain.  Respiratory:  Negative for cough, hemoptysis, shortness of breath and wheezing.   Cardiovascular:  Negative for chest pain, palpitations, leg swelling and PND.  Gastrointestinal:  Negative for abdominal pain, blood in stool, heartburn and vomiting.  Neurological:  Negative for dizziness, tingling, weakness and headaches.   OBJECTIVE:   BP (!) 150/80   Pulse 71   Wt 57.3 kg   SpO2 98%   BMI 21.01 kg/m    Physical Exam General: Alert, well appearing, NAD, Oriented x4 Cardiovascular: RRR, No Murmurs, Normal S2/S2 Respiratory: CTAB, No wheezing or Rales Abdomen: No distension or tenderness Extremities: No edema on extremities   Skin: Warm and dry  ASSESSMENT/PLAN:   Medication Refill Healthy 75 yo female with no reported adverse effect from her medications. Her  BP is stable on current treatment.  -Explained to patient amlodipine doesn't necessarily stretch the vein, rather relaxed the muscle within the vessels and unlikely to cause vessel rupture.   -Refilled patient's medication   Health Maintenance -Patient was due for Covid, Flu, Shingles and Tetanus Vaccine. Vaccines were offered and patient declined.  -Discussed colonoscopy and Dexa scan which patient declined; she doesn't see the need for them at this time.     Alen Bleacher, MD [place]

## 2021-04-08 ENCOUNTER — Other Ambulatory Visit: Payer: Self-pay | Admitting: Family Medicine

## 2021-04-08 DIAGNOSIS — I10 Essential (primary) hypertension: Secondary | ICD-10-CM

## 2021-05-10 ENCOUNTER — Other Ambulatory Visit: Payer: Self-pay | Admitting: Family Medicine

## 2021-05-10 DIAGNOSIS — I10 Essential (primary) hypertension: Secondary | ICD-10-CM

## 2021-07-04 ENCOUNTER — Other Ambulatory Visit: Payer: Self-pay | Admitting: Student

## 2021-07-04 DIAGNOSIS — I10 Essential (primary) hypertension: Secondary | ICD-10-CM

## 2021-07-28 ENCOUNTER — Other Ambulatory Visit: Payer: Self-pay

## 2021-07-28 ENCOUNTER — Ambulatory Visit: Payer: Medicare PPO | Admitting: Family Medicine

## 2021-07-28 VITALS — BP 143/80 | HR 70 | Wt 125.2 lb

## 2021-07-28 DIAGNOSIS — J069 Acute upper respiratory infection, unspecified: Secondary | ICD-10-CM | POA: Diagnosis not present

## 2021-07-28 MED ORDER — FLUTICASONE PROPIONATE 50 MCG/ACT NA SUSP: 2.0000 | Freq: Every day | NASAL | 6 refills | Status: DC

## 2021-07-28 MED ORDER — FLUTICASONE PROPIONATE 50 MCG/ACT NA SUSP: 2.0000 | Freq: Every day | NASAL | 6 refills | Status: AC | PRN

## 2021-07-28 NOTE — Patient Instructions (Addendum)
It was nice seeing you today!  Use Flonase once a day as needed.  A humidifier may help for dryness.  Please let us know if things are not getting better in the next week.  Stay well, Zola Button, MD [place] [phone]

## 2021-07-28 NOTE — Progress Notes (Signed)
° ° °  SUBJECTIVE:   CHIEF COMPLAINT / HPI: sinus problems  Patient presents for sinus congestion that started 6 days ago with associated cough.  She thinks she may have had a fever on the first day but none since.  Otherwise, she is feeling okay, primarily concerned about the congestion and whether she will need antibiotics.  Denies chest pain, shortness of breath, abdominal pain, diarrhea, headache, vision changes.  Multiple family members have been ill as well.  She has not been tested for anything.  She states her blood pressure at home typically runs in the 878M systolic prior to current illness.  She states her blood pressure has been more labile with current illness, sometimes as high as 150s over 80s but will go back down to 130s when repeated.  PERTINENT  PMH / PSH: HTN, CVA, HLD  OBJECTIVE:   BP (!) 143/80    Pulse 70    Wt 125 lb 3.2 oz (56.8 kg)    SpO2 100%    BMI 20.83 kg/m   General: Alert elderly female, NAD HEENT: MMM, posterior oropharynx clear, no frontal or maxillary sinus tenderness Neck: Supple, no LAD CV: RRR, no murmurs Pulm: CTAB, no wheezes or rales  ASSESSMENT/PLAN:   Viral illness Mild, low suspicion for bacterial etiology.  Opted not to test for COVID as she is 6 days into current illness.  Supportive care, advised humidifier and Flonase prescription given.  Could consider antibiotic prescription if not improved in 1 week to treat for bacterial sinusitis.   Zola Button, MD [place]

## 2021-08-13 ENCOUNTER — Other Ambulatory Visit: Payer: Self-pay | Admitting: Family Medicine

## 2021-08-13 DIAGNOSIS — I63512 Cerebral infarction due to unspecified occlusion or stenosis of left middle cerebral artery: Secondary | ICD-10-CM

## 2021-08-22 ENCOUNTER — Other Ambulatory Visit: Payer: Self-pay | Admitting: Family Medicine

## 2021-08-22 DIAGNOSIS — I63512 Cerebral infarction due to unspecified occlusion or stenosis of left middle cerebral artery: Secondary | ICD-10-CM

## 2021-09-06 ENCOUNTER — Other Ambulatory Visit: Payer: Self-pay | Admitting: Student

## 2021-09-06 DIAGNOSIS — I10 Essential (primary) hypertension: Secondary | ICD-10-CM

## 2021-09-30 ENCOUNTER — Other Ambulatory Visit: Payer: Self-pay | Admitting: Student

## 2021-09-30 DIAGNOSIS — E785 Hyperlipidemia, unspecified: Secondary | ICD-10-CM

## 2021-10-02 ENCOUNTER — Other Ambulatory Visit: Payer: Self-pay

## 2021-10-02 ENCOUNTER — Encounter: Payer: Self-pay | Admitting: Student

## 2021-10-02 ENCOUNTER — Ambulatory Visit: Payer: Medicare PPO | Admitting: Student

## 2021-10-02 VITALS — BP 154/69 | HR 77 | Wt 125.4 lb

## 2021-10-02 DIAGNOSIS — B349 Viral infection, unspecified: Secondary | ICD-10-CM

## 2021-10-02 DIAGNOSIS — I10 Essential (primary) hypertension: Secondary | ICD-10-CM

## 2021-10-02 NOTE — Patient Instructions (Addendum)
It was wonderful to see you today. Thank you for allowing me to be a part of your care. Below is a short summary of what we discussed at your visit today: ? ?Your symptoms last week are consistent with viral illness which seems to have improved.  No concerns at this time the absence of fever or difficulty breathing. ? ?Your blood pressure diary shows improved blood pressure.  Hold off on Amlodipine for a month and follow-up to recheck blood pressure during this time. ? ?Please bring all of your medications to every appointment! ? ?If you have any questions or concerns, please do not hesitate to contact us via phone or MyChart message.  ? ?Alen Bleacher, MD ?[facility]  ?

## 2021-10-02 NOTE — Progress Notes (Signed)
? ? ?  SUBJECTIVE:  ? ?CHIEF COMPLAINT / HPI:  ? ? Patient reported feeling ill sometime last Thursday with fever. Tactile fever was treated with tylenol which provided some relieve. Associated symptoms include post nasal drips and running nose. Denies any recent sick contact. No headache, cough or difficulty breathing. Symptoms improved after a day however she still have running nose and nasal drip. ? ?HTN ?Patient's current HTN medication include amlodipine 5 mg and losartan 50 mg daily.  Tolerating well except for intermittent BLE edema. Patient endorses compliance with these medications and BP diary shows blood pressure reading mostly in the 120s/70s.   ? ? ? ?PERTINENT  PMH / PSH: HTN, Stroke, HLD. ? ?OBJECTIVE:  ? ?BP (!) 154/69   Pulse 77   Wt 56.9 kg   SpO2 98%   BMI 20.87 kg/m?   ? ?Physical Exam ?General: Alert, well appearing, NAD ?Cardiovascular: RRR, No Murmurs, Normal S2/S2 ?Respiratory: CTAB, No wheezing or Rales ?Extremities: No edema on extremities   ? ?ASSESSMENT/PLAN:  ? ?Essential hypertension ?BP today was 154/69.  Patient's home BP diary shows blood pressures in the 120s/70s.  He is currently on losartan 50 mg daily and amlodipine 5 mg daily.  Its intermittent BLE edema.  With impressive BP diary and report of intermittent BLE edema we will hold amlodipine for a month.  Recommend patient continues BP diary and follow-up in a month to assess BP. ? ?Viral illness ?With complaint of tactile fever, runny nose, and oropharyngeal irritation; patient's presentation is most likely due to viral illness.  She is afebrile today and denies having any cough, sore breath or chest pain.  Physical exam was normal. Discussed conservative management . Recommended tylenol or ibuprofen for fever. Outline signs and symptoms that will warrant ED visit or return for further assessment.   ? ? ? ?Alen Bleacher, MD ?[place]  ? ? ?

## 2021-10-02 NOTE — Assessment & Plan Note (Signed)
BP today was 154/69.  Patient's home BP diary shows blood pressures in the 120s/70s.  He is currently on losartan 50 mg daily and amlodipine 5 mg daily.  Its intermittent BLE edema.  With impressive BP diary and report of intermittent BLE edema we will hold amlodipine for a month.  Recommend patient continues BP diary and follow-up in a month to assess BP. ?

## 2021-10-05 ENCOUNTER — Other Ambulatory Visit: Payer: Self-pay | Admitting: Student

## 2021-10-05 DIAGNOSIS — I10 Essential (primary) hypertension: Secondary | ICD-10-CM

## 2021-10-11 ENCOUNTER — Other Ambulatory Visit: Payer: Self-pay

## 2021-10-11 ENCOUNTER — Ambulatory Visit: Payer: Medicare PPO | Admitting: Dermatology

## 2021-10-11 ENCOUNTER — Other Ambulatory Visit: Payer: Self-pay | Admitting: Dermatology

## 2021-10-11 DIAGNOSIS — Z1283 Encounter for screening for malignant neoplasm of skin: Secondary | ICD-10-CM | POA: Diagnosis not present

## 2021-10-11 DIAGNOSIS — L820 Inflamed seborrheic keratosis: Secondary | ICD-10-CM | POA: Diagnosis not present

## 2021-10-11 DIAGNOSIS — L570 Actinic keratosis: Secondary | ICD-10-CM

## 2021-10-11 DIAGNOSIS — D485 Neoplasm of uncertain behavior of skin: Secondary | ICD-10-CM

## 2021-10-11 DIAGNOSIS — D0439 Carcinoma in situ of skin of other parts of face: Secondary | ICD-10-CM | POA: Diagnosis not present

## 2021-10-11 NOTE — Patient Instructions (Signed)

## 2021-10-24 ENCOUNTER — Encounter: Payer: Self-pay | Admitting: Dermatology

## 2021-10-24 NOTE — Progress Notes (Signed)
? ?  Follow-Up Visit ?  ?Subjective  ?Heather Robbins is a 76 y.o. female who presents for the following: Skin Problem (Pt has spots of concern on the face she would like evaluated and possibly removed ). ? ?Growth right-sided Bell's plus check several other areas ?Location:  ?Duration:  ?Quality:  ?Associated Signs/Symptoms: ?Modifying Factors:  ?Severity:  ?Timing: ?Context:  ? ?Objective  ?Well appearing patient in no apparent distress; mood and affect are within normal limits. ?Back ?Sun exposed areas and back examined: No atypical pigmented lesions.  1 possible nonmelanoma skin cancer right nose will be biopsied and treated. ? 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 ? ? ? ? ? ?Left Scaphoid Fossa ?Horn like 3 mm pink crust ? ?Mid Frontal Scalp, Right Forehead, Right Frontal Scalp ?Inflamed textured flattopped 4 to 6 mm pink-tan papules ? ? ? ?All sun exposed areas plus back examined. ? ? ?Assessment & Plan  ? ? ?Neoplasm of uncertain behavior of skin ?Right Nasal Sidewall ? ?Skin / nail biopsy ?Type of biopsy: tangential   ?Informed consent: discussed and consent obtained   ?Timeout: patient name, date of birth, surgical site, and procedure verified   ?Anesthesia: the lesion was anesthetized in a standard fashion   ?Anesthetic:  1% lidocaine w/ epinephrine 1-100,000 local infiltration ?Instrument used: flexible razor blade   ?Hemostasis achieved with: ferric subsulfate and electrodesiccation   ?Outcome: patient tolerated procedure well   ?Post-procedure details: wound care instructions given   ? ?Destruction of lesion ?Complexity: simple   ?Destruction method: electrodesiccation and curettage   ?Informed consent: discussed and consent obtained   ?Timeout:  patient name, date of birth, surgical site, and procedure verified ?Anesthesia: the lesion was anesthetized in a standard fashion   ?Anesthetic:  1% lidocaine w/ epinephrine 1-100,000 local infiltration ?Curettage  performed in three different directions: Yes   ?Electrodesiccation performed over the curetted area: Yes   ?Curettage cycles:  3 ?Lesion length (cm):  0.6 ?Lesion width (cm):  0.6 ?Margin per side (cm):  0 ?Final wound size (cm):  0.6 ?Hemostasis achieved with:  ferric subsulfate ?Outcome: patient tolerated procedure well with no complications   ?Post-procedure details: wound care instructions given   ? ?Specimen 1 - Surgical pathology ?Differential Diagnosis: R/O BCC VS SCC - TXPBX ? ?Check Margins: No ? ?After shave biopsy the base was treated with curettage and cautery ? ?Actinic keratosis ?Left Scaphoid Fossa ? ?Destruction of lesion - Left Scaphoid Fossa ?Complexity: simple   ?Destruction method: cryotherapy   ?Informed consent: discussed and consent obtained   ?Lesion destroyed using liquid nitrogen: Yes   ?Cryotherapy cycles:  5 ?Outcome: patient tolerated procedure well with no complications   ? ?Screening exam for skin cancer ?Back ? ?Annual skin examination ? ?Inflamed seborrheic keratosis (3) ?Right Frontal Scalp; Mid Frontal Scalp; Right Forehead ? ?Destruction of lesion - Mid Frontal Scalp, Right Forehead, Right Frontal Scalp ?Complexity: simple   ?Destruction method: cryotherapy   ?Informed consent: discussed and consent obtained   ?Timeout:  patient name, date of birth, surgical site, and procedure verified ?Lesion destroyed using liquid nitrogen: Yes   ?Cryotherapy cycles:  3 ?Outcome: patient tolerated procedure well with no complications   ?Post-procedure details: wound care instructions given   ? ? ? ? ? ?I, [name], MD, have reviewed all documentation for this visit.  The documentation on 10/24/21 for the exam, diagnosis, procedures, and orders are all accurate and complete. ?

## 2021-11-02 ENCOUNTER — Ambulatory Visit: Payer: Medicare PPO | Admitting: Student

## 2021-11-02 ENCOUNTER — Encounter: Payer: Self-pay | Admitting: Student

## 2021-11-02 VITALS — BP 173/78 | HR 71 | Ht 65.0 in | Wt 125.4 lb

## 2021-11-02 DIAGNOSIS — I10 Essential (primary) hypertension: Secondary | ICD-10-CM

## 2021-11-02 MED ORDER — HYDROCHLOROTHIAZIDE 12.5 MG PO CAPS: 12.5000 mg | ORAL_CAPSULE | Freq: Every day | ORAL | 1 refills | Status: DC

## 2021-11-02 NOTE — Patient Instructions (Addendum)
It was wonderful to see you today. Thank you for allowing me to be a part of your care. Below is a short summary of what we discussed at your visit today: ? ?Your blood pressure is slightly elevated.  We will start you on 12.5 mcg hydrochlorothiazide (HCTZ) to be taken daily. And continue your Losartan in addition to HCTZ. ? ?Discontinue the amilodipine ? ?Continue daily blood pressure diary and follow-up in a months for reassessment. ? ? ?If you have any questions or concerns, please do not hesitate to contact us via phone or MyChart message.  ? ?Alen Bleacher, MD ?[facility]  ?

## 2021-11-02 NOTE — Assessment & Plan Note (Addendum)
Patient's blood pressures since discontinuing amlodipine has slightly increased.  BP today was 173/78.  Patient is currently on losartan 50 mg daily.  Will add HCTZ 12.5 mg daily.  Recommend continue daily BP diary and follow-up in a month to reassess baseline BP.  Return precautions reviewed and patient is agreeable to plan. ?

## 2021-11-02 NOTE — Progress Notes (Signed)
? ? ?  SUBJECTIVE:  ? ?CHIEF COMPLAINT / HPI: BP follow up ? ?Patient's Amlodipine was held a month ago for BLE edema. Her BP diary since discontinuing Amlodipine is increased up to 170s/90 but mostly ranging from 140-150s/80s .She is currently on losartan 50 mg daily. Patient report feeling more energetic and breathing better since discontinue Amilodipine. ? ?PERTINENT  PMH / PSH: HTN, Stroke and HLD ? ?OBJECTIVE:  ? ?BP (!) 173/78   Pulse 71   Ht '5\' 5"'$  (1.651 m)   Wt 125 lb 6 oz (56.9 kg)   SpO2 99%   BMI 20.86 kg/m?   ? ?Physical Exam ?General: Alert, well appearing, NAD ?Cardiovascular: RRR, No Murmurs, Normal S2/S2 ?Respiratory: CTAB, No wheezing or Rales ?Extremities: No edema on extremities   ? ? ?ASSESSMENT/PLAN:  ? ?Essential hypertension ?Patient's blood pressures since discontinuing amlodipine has slightly increased.  BP today was 173/78.  Patient is currently on losartan 50 mg daily.  Will add HCTZ 12.5 mg daily.  Recommend continue daily BP diary and follow-up in a month to reassess baseline BP.  Return precautions reviewed and patient is agreeable to plan. ? ?Follow-up in a months to reassess BP ?  ? ? ?Alen Bleacher, MD ?[place]  ? ? ?

## 2021-11-27 ENCOUNTER — Ambulatory Visit: Payer: Medicare PPO | Admitting: Student

## 2021-11-27 ENCOUNTER — Encounter: Payer: Self-pay | Admitting: Student

## 2021-11-27 VITALS — BP 146/78 | HR 61 | Ht 65.0 in | Wt 123.8 lb

## 2021-11-27 DIAGNOSIS — I10 Essential (primary) hypertension: Secondary | ICD-10-CM

## 2021-11-27 NOTE — Progress Notes (Signed)
? ? ?  SUBJECTIVE:  ? ?CHIEF COMPLAINT / HPI:  ? ?Hypertension: ?Patient is a 76 y.o. female who present today for follow up of hypertension after adding HCTZ to her BP meds.  ? ?Patient endorses no problems with compliance and tolerance to home medication. ?Home BP medications include: Losartan 50 mg and HCTZ 12.5 mg di ?BP range at home 120-130/80s ? ?Most recent creatinine trend:  ?Lab Results  ?Component Value Date  ? CREATININE 0.85 10/19/2020  ? CREATININE 0.95 01/11/2020  ? CREATININE 0.88 03/17/2019  ? ?Patient does check blood pressure at home. ? ?Patient has not had a BMP in the past 1 year.  ? ?PERTINENT  PMH / PSH: Stroke, HLD ? ?OBJECTIVE:  ? ?BP (!) 146/78   Pulse 61   Ht '5\' 5"'$  (1.651 m)   Wt 123 lb 12.8 oz (56.2 kg)   SpO2 100%   BMI 20.60 kg/m?   ? ?Physical Exam ?General: Alert, well appearing, NAD ?Cardiovascular: RRR, No Murmurs, Normal S2/S2 ?Respiratory: CTAB, No wheezing or Rales ?Extremities: No edema on extremities   ? ? ?ASSESSMENT/PLAN:  ? ?Essential hypertension ?BP is currently well controlled on current treatment regime with good tolerance to her medication. ?-Continue Losartan and HCTZ for BP control ?-Will obtain BMP ?-Follow up at wellness visit or sooner as needed  ? ? ?Alen Bleacher, MD ?[place]  ? ? ?

## 2021-11-27 NOTE — Patient Instructions (Signed)
It was wonderful to see you today. Thank you for allowing me to be a part of your care. Below is a short summary of what we discussed at your visit today: ? ?Your blood pressure from your home diary are reassuring ? ?Continue losartan and HCTZ for blood pressure  ? ?Will obtain lab today to check your electrolyte, kidney and liver function. ? ?Follow up at your annual wellness visit or sooner as needed ? ? ?If you have any questions or concerns, please do not hesitate to contact us via phone or MyChart message.  ? ?Alen Bleacher, MD ?[facility]  ?

## 2021-11-27 NOTE — Assessment & Plan Note (Signed)
BP is currently well controlled on current treatment regime with good tolerance to her medication. ?-Continue Losartan and HCTZ for BP control ?-Will obtain BMP ?-Follow up at wellness visit or sooner as needed  ?

## 2021-11-28 LAB — COMPREHENSIVE METABOLIC PANEL
ALT: 77 IU/L — ABNORMAL HIGH (ref 0–32)
AST: 77 IU/L — ABNORMAL HIGH (ref 0–40)
Albumin/Globulin Ratio: 1.5 (ref 1.2–2.2)
Albumin: 4.3 g/dL (ref 3.7–4.7)
Alkaline Phosphatase: 119 IU/L (ref 44–121)
BUN/Creatinine Ratio: 12 (ref 12–28)
BUN: 11 mg/dL (ref 8–27)
Bilirubin Total: 0.5 mg/dL (ref 0.0–1.2)
CO2: 26 mmol/L (ref 20–29)
Calcium: 9.5 mg/dL (ref 8.7–10.3)
Chloride: 103 mmol/L (ref 96–106)
Creatinine, Ser: 0.92 mg/dL (ref 0.57–1.00)
Globulin, Total: 2.9 g/dL (ref 1.5–4.5)
Glucose: 85 mg/dL (ref 70–99)
Potassium: 4 mmol/L (ref 3.5–5.2)
Sodium: 148 mmol/L — ABNORMAL HIGH (ref 134–144)
Total Protein: 7.2 g/dL (ref 6.0–8.5)
eGFR: 65 mL/min/{1.73_m2} (ref >59)

## 2021-12-01 ENCOUNTER — Telehealth: Payer: Self-pay | Admitting: Student

## 2021-12-01 NOTE — Telephone Encounter (Signed)
Called patient to discuss her lab result but unable to reach her. Left a generic VM.  ?

## 2021-12-08 NOTE — Telephone Encounter (Signed)
Patient LVM returning PCP call regarding labs.  ? ?Will forward to PCP.  ?

## 2021-12-12 NOTE — Telephone Encounter (Signed)
VM left asking patient to call me back to discuss labs.  ? ?Will await her call and will schedule a FU at that time.  ?

## 2021-12-18 ENCOUNTER — Other Ambulatory Visit: Payer: Self-pay | Admitting: Student

## 2021-12-18 DIAGNOSIS — H52223 Regular astigmatism, bilateral: Secondary | ICD-10-CM | POA: Diagnosis not present

## 2021-12-18 DIAGNOSIS — I10 Essential (primary) hypertension: Secondary | ICD-10-CM

## 2022-01-06 ENCOUNTER — Other Ambulatory Visit: Payer: Self-pay | Admitting: Student

## 2022-01-06 DIAGNOSIS — I10 Essential (primary) hypertension: Secondary | ICD-10-CM

## 2022-01-12 ENCOUNTER — Encounter: Payer: Self-pay | Admitting: Student

## 2022-01-12 ENCOUNTER — Ambulatory Visit: Payer: Medicare PPO | Admitting: Student

## 2022-01-12 VITALS — BP 147/68 | HR 67 | Ht 65.0 in | Wt 127.2 lb

## 2022-01-12 DIAGNOSIS — R7401 Elevation of levels of liver transaminase levels: Secondary | ICD-10-CM

## 2022-01-12 NOTE — Patient Instructions (Signed)
It was wonderful to see you today. Thank you for allowing me to be a part of your care. Below is a short summary of what we discussed at your visit today:  We will check your liver enzyme levels today  Avoid taking tylenol or alcohol consumption for the time being  If you have any questions or concerns, please do not hesitate to contact us via phone or MyChart message.   Alen Bleacher, MD [facility]

## 2022-01-12 NOTE — Progress Notes (Signed)
    SUBJECTIVE:   CHIEF COMPLAINT / HPI: Follow up for elevated transaminase   76 year old female who presents for follow-up due to transaminitis.  At last visit patient CMP showed elevated AST and ALT to 77. Patient presented today for recheck of her labs. Denies any RUQ pain. Patient said she rarely drinks alcohol and seldom use tylenol. No recent travel.  Her home medication include Lipitor, Aspitin and Ezetimibe and she endorses compliance to her medication   PERTINENT  PMH / PSH: CVA, HLD, HTN  OBJECTIVE:   BP (!) 147/68   Pulse 67   Ht '5\' 5"'$  (1.651 m)   Wt 127 lb 3.2 oz (57.7 kg)   SpO2 99%   BMI 21.17 kg/m     Physical Exam General: Alert, well appearing, NAD,  Cardiovascular: RRR, No Murmurs Respiratory: Normal work of breathing on room air Abdomen: No distension or tenderness Extremities: No edema on extremities    ASSESSMENT/PLAN:  Transaminitis Patient without significant history of alcohol use or recent travel.  She is at Low risk for hepatitis. Likely transient transaminitis secondary to medication.  Patient currently on Lipitor and aspirin. -Obtain CMP to trend liver enzymes. -Consider hepatitis work up for worsening transaminitis       Alen Bleacher, MD [place]

## 2022-01-13 LAB — COMPREHENSIVE METABOLIC PANEL
ALT: 58 IU/L — ABNORMAL HIGH (ref 0–32)
AST: 59 IU/L — ABNORMAL HIGH (ref 0–40)
Albumin/Globulin Ratio: 1.5 (ref 1.2–2.2)
Albumin: 4.4 g/dL (ref 3.7–4.7)
Alkaline Phosphatase: 99 IU/L (ref 44–121)
BUN/Creatinine Ratio: 19 (ref 12–28)
BUN: 19 mg/dL (ref 8–27)
Bilirubin Total: 0.6 mg/dL (ref 0.0–1.2)
CO2: 25 mmol/L (ref 20–29)
Calcium: 9.5 mg/dL (ref 8.7–10.3)
Chloride: 104 mmol/L (ref 96–106)
Creatinine, Ser: 0.98 mg/dL (ref 0.57–1.00)
Globulin, Total: 3 g/dL (ref 1.5–4.5)
Glucose: 88 mg/dL (ref 70–99)
Potassium: 3.8 mmol/L (ref 3.5–5.2)
Sodium: 144 mmol/L (ref 134–144)
Total Protein: 7.4 g/dL (ref 6.0–8.5)
eGFR: 60 mL/min/{1.73_m2} (ref >59)

## 2022-01-17 NOTE — Telephone Encounter (Signed)
Called patient to review lab result. Unable to reach patient and left a generic voicemail

## 2022-02-18 ENCOUNTER — Other Ambulatory Visit: Payer: Self-pay | Admitting: Student

## 2022-02-18 DIAGNOSIS — I10 Essential (primary) hypertension: Secondary | ICD-10-CM

## 2022-03-31 ENCOUNTER — Other Ambulatory Visit: Payer: Self-pay | Admitting: Student

## 2022-03-31 DIAGNOSIS — E785 Hyperlipidemia, unspecified: Secondary | ICD-10-CM

## 2022-04-15 ENCOUNTER — Other Ambulatory Visit: Payer: Self-pay | Admitting: Student

## 2022-04-15 DIAGNOSIS — I10 Essential (primary) hypertension: Secondary | ICD-10-CM

## 2022-04-29 ENCOUNTER — Other Ambulatory Visit: Payer: Self-pay | Admitting: Student

## 2022-04-29 DIAGNOSIS — I10 Essential (primary) hypertension: Secondary | ICD-10-CM

## 2022-06-14 ENCOUNTER — Other Ambulatory Visit: Payer: Self-pay | Admitting: Student

## 2022-06-14 DIAGNOSIS — I10 Essential (primary) hypertension: Secondary | ICD-10-CM

## 2022-06-25 ENCOUNTER — Ambulatory Visit: Payer: Medicare PPO | Admitting: Student

## 2022-06-25 ENCOUNTER — Encounter: Payer: Self-pay | Admitting: Student

## 2022-06-25 VITALS — BP 132/76 | HR 59 | Ht 65.0 in | Wt 128.4 lb

## 2022-06-25 DIAGNOSIS — Z23 Encounter for immunization: Secondary | ICD-10-CM

## 2022-06-25 DIAGNOSIS — I10 Essential (primary) hypertension: Secondary | ICD-10-CM | POA: Diagnosis not present

## 2022-06-25 DIAGNOSIS — I63512 Cerebral infarction due to unspecified occlusion or stenosis of left middle cerebral artery: Secondary | ICD-10-CM

## 2022-06-25 MED ORDER — ASPIRIN 81 MG PO TBEC: DELAYED_RELEASE_TABLET | ORAL | 3 refills | Status: DC

## 2022-06-25 NOTE — Patient Instructions (Signed)
It was wonderful to see you today. Thank you for allowing me to be a part of your care. Below is a short summary of what we discussed at your visit today:  Your blood pressure reading from your diary shows your blood pressures are within range.    In the your blood pressure has remained stable after you have discontinued the hydrochlorothiazide.  It is okay to continue only on the losartan.  Start noticing your blood pressure being high and staying high please call to follow-up for reassessment.  Today we gave you your pneumococcal vaccine PCV 20.  Please bring all of your medications to every appointment!  If you have any questions or concerns, please do not hesitate to contact us via phone or MyChart message.   Alen Bleacher, MD [facility]

## 2022-06-25 NOTE — Assessment & Plan Note (Signed)
Patient's blood pressure today was 132/76.  Her blood pressure diary shows reading of 120-130s/70s.  Blood pressure is well-controlled and given it is stable on just losartan and reports of intolerance to HCTZ discontinue HCTZ. -Discontinued HCTZ -Continue losartan 50 mg daily - Encouraged patient to continue home monitoring of BP. If it gets high and remain high advised patient to follow up for reevaluation.

## 2022-06-25 NOTE — Progress Notes (Signed)
    SUBJECTIVE:   CHIEF COMPLAINT / HPI:   Patient is a 76 year old female presenting today for blood pressure follow-up. Her blood pressure readings at home show 120-130s/70s. Endorses compliance to home medication which includes HCTZ 12.5 and losartan 50 mg daily. But stop taking the HCTZ two weeks ago because she didn't feel well with it. It made her feel full/ heavy. Denies any headaches, vision changes or shortness of breath.  HCM Pt due for Covid booster , shingles, tetnaus, Dexan scan and colonoscopy    PERTINENT  PMH / PSH: Reviewed   OBJECTIVE:   BP 132/76   Pulse (!) 59   Ht '5\' 5"'$  (1.651 m)   Wt 128 lb 6.4 oz (58.2 kg)   SpO2 97%   BMI 21.37 kg/m     Physical Exam General: Alert, well appearing, NAD Cardiovascular: RRR, No Murmurs, Normal S2/S2 Respiratory: CTAB, No wheezing or Rales Abdomen: No distension or tenderness Extremities: No edema on extremities     ASSESSMENT/PLAN:   Essential hypertension Patient's blood pressure today was 132/76.  Her blood pressure diary shows reading of 120-130s/70s.  Blood pressure is well-controlled and given it is stable on just losartan and reports of intolerance to HCTZ discontinue HCTZ. -Discontinued HCTZ -Continue losartan 50 mg daily - Encouraged patient to continue home monitoring of BP. If it gets high and remain high advised patient to follow up for reevaluation.   HCM Patient declines COVID booster, Shingrix and DEXA scan. Patient received her PCV 20 vaccine today.  Risk and benefit discussed with patient who verbalized understanding.  Alen Bleacher, MD [place]

## 2022-06-28 ENCOUNTER — Other Ambulatory Visit: Payer: Self-pay | Admitting: Student

## 2022-06-28 DIAGNOSIS — E785 Hyperlipidemia, unspecified: Secondary | ICD-10-CM

## 2022-07-16 ENCOUNTER — Telehealth: Payer: Self-pay

## 2022-07-16 NOTE — Telephone Encounter (Signed)
Patient calls nurse line reporting elevated home blood pressure readings.   She reports numbers in the 150s over 90s. She reports this started to happen ~ 2 weeks ago after she stopped taking HCTZ. She reports the medication made her feel unwell. She reports compliance with Losartan.   She denies any chest pains, SOB, dizziness, headaches or vision changes.   Patient scheduled for 12/22 with PCP.

## 2022-07-20 ENCOUNTER — Ambulatory Visit: Payer: Medicare PPO | Admitting: Student

## 2022-07-20 ENCOUNTER — Encounter: Payer: Self-pay | Admitting: Student

## 2022-07-20 DIAGNOSIS — I10 Essential (primary) hypertension: Secondary | ICD-10-CM

## 2022-07-20 MED ORDER — LOSARTAN POTASSIUM 100 MG PO TABS: 100.0000 mg | ORAL_TABLET | Freq: Every day | ORAL | 2 refills | Status: DC

## 2022-07-20 NOTE — Patient Instructions (Signed)
It was wonderful to see you today. Thank you for allowing me to be a part of your care. Below is a short summary of what we discussed at your visit today:  Your blood pressure home readings show an increase in your pressures.  I recommend increasing your losartan to 100 mg daily.  Please continue to monitor your blood pressure and if it is drops below 100/70 consistently or she started noticing lightheadedness or dizziness please reach out to follow-up with [place].  If you have any questions or concerns, please do not hesitate to contact us via phone or MyChart message.   Alen Bleacher, MD [facility]

## 2022-07-20 NOTE — Assessment & Plan Note (Signed)
Blood pressure today is 142/76.  Home BP readings showed increased blood pressure is discontinuing her HCTZ.  Currently on losartan 50 mg daily which she endorses compliance and good tolerance.   -Increased her losartan to 100 mg daily. - Encouraged patient to continue checking her ambulatory blood pressure and keep diary -Reviewed return precautions with patient which include symptoms of lightheadedness, dizziness or low blood pressure readings below 100/60

## 2022-07-20 NOTE — Progress Notes (Signed)
    SUBJECTIVE:   CHIEF COMPLAINT / HPI:   Patient is a 76 year old female presenting today for follow-up on blood pressure. She recently discontinued her HCTZ because it made her fill full and bloated. Currently only on losartan 50 mg daily. Doses compliance to her medication. Home blood pressure readings 130-150s/ 70-90s. Have more readings in the 150s Denies any headache, vision changes, chest pain, LE edema or shortness of breath.  PERTINENT  PMH / PSH: Reviewed   OBJECTIVE:   BP (!) 142/76   Pulse 65   Wt 124 lb 9.6 oz (56.5 kg)   SpO2 98%   BMI 20.73 kg/m    Physical Exam General: Alert, well appearing, NAD Cardiovascular: RRR, No Murmurs, Normal S2/S2 Respiratory: CTAB, No wheezing or Rales Extremities: No edema on extremities     ASSESSMENT/PLAN:   Essential hypertension Blood pressure today is 142/76.  Home BP readings showed increased blood pressure is discontinuing her HCTZ.  Currently on losartan 50 mg daily which she endorses compliance and good tolerance.   -Increased her losartan to 100 mg daily. - Encouraged patient to continue checking her ambulatory blood pressure and keep diary -Reviewed return precautions with patient which include symptoms of lightheadedness, dizziness or low blood pressure readings below 100/60     Alen Bleacher, MD [place]

## 2022-08-11 ENCOUNTER — Other Ambulatory Visit: Payer: Self-pay | Admitting: Student

## 2022-08-11 DIAGNOSIS — I63512 Cerebral infarction due to unspecified occlusion or stenosis of left middle cerebral artery: Secondary | ICD-10-CM

## 2022-10-16 ENCOUNTER — Telehealth: Payer: Self-pay | Admitting: Student

## 2022-10-16 NOTE — Telephone Encounter (Signed)
Called patient to schedule Medicare Annual Wellness Visit (AWV). Left message for patient to call back and schedule Medicare Annual Wellness Visit (AWV).  Last date of AWV: AWVI eligible as of 01/28/2012  Please schedule an AWVI appointment at any time with [place].  If any questions, please contact me at [phone].    Thank you,  [place] Direct dial  [phone]

## 2022-10-17 ENCOUNTER — Ambulatory Visit: Payer: Medicare PPO | Admitting: Dermatology

## 2022-10-18 ENCOUNTER — Telehealth: Payer: Self-pay | Admitting: Student

## 2022-10-18 NOTE — Telephone Encounter (Signed)
Contacted Heather Robbins to schedule their annual wellness visit. Appointment made for 10/22/2022.  Thank you,  [place] Direct dial  [phone]

## 2022-10-21 NOTE — Progress Notes (Unsigned)
I connected with  Heather Robbins on 10/22/2022 by a audio enabled telemedicine application and verified that I am speaking with the correct person using two identifiers.  Patient Location: Home  Provider Location: Home Office  I discussed the limitations of evaluation and management by telemedicine. The patient expressed understanding and agreed to proceed.  Subjective:   Heather Robbins is a 77 y.o. female who presents for an Initial Medicare Annual Wellness Visit.  Review of Systems    Per HPI unless specifically indicated below.  Cardiac Risk Factors include: advanced age (>31men, >103 women);female gender, Essential Hypertension, an Hyperlipidemia.            Objective:       07/20/2022    9:55 AM 06/25/2022    2:30 PM 01/12/2022    2:17 PM  Vitals with BMI  Height  5\' 5"    Weight 124 lbs 10 oz 128 lbs 6 oz   BMI 20.73 21.37   Systolic 142 132 161  Diastolic 76 76 68  Pulse 65 59     There were no vitals filed for this visit. There is no height or weight on file to calculate BMI.     07/20/2022    9:55 AM 06/25/2022    2:32 PM 01/12/2022    1:57 PM 11/02/2021    8:37 AM 10/02/2021    2:07 PM 04/06/2021    1:37 PM 10/19/2020    1:46 PM  Advanced Directives  Does Patient Have a Medical Advance Directive? No No No No No No No  Would patient like information on creating a medical advance directive? No - Patient declined No - Patient declined No - Patient declined No - Patient declined No - Patient declined No - Patient declined No - Patient declined    Current Medications (verified) Outpatient Encounter Medications as of 10/22/2022  Medication Sig   acetaminophen (TYLENOL) 325 MG tablet Take 325 mg by mouth daily as needed for moderate pain or headache.   aspirin EC (ASPIRIN LOW DOSE) 81 MG tablet TAKE 1 TABLET BY MOUTH DAILY SWALLOW WHOLE   atorvastatin (LIPITOR) 80 MG tablet TAKE 1 TABLET BY MOUTH DAILY AT 6PM   ezetimibe (ZETIA) 10 MG tablet TAKE 1  TABLET BY MOUTH DAILY   fluticasone (FLONASE) 50 MCG/ACT nasal spray Place 2 sprays into both nostrils daily as needed for allergies or rhinitis.   losartan (COZAAR) 100 MG tablet Take 1 tablet (100 mg total) by mouth at bedtime.   No facility-administered encounter medications on file as of 10/22/2022.    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   Essential hypertension 12/12/2018   Family history of chronic renal disease    Family history of MI (myocardial infarction)    History of stroke 09/13/2017   HLD (hyperlipidemia)    HTN (hypertension)    ST segment depression    Stroke (HCC) 08/2017   Past Surgical History:  Procedure Laterality Date   CESAREAN SECTION     LOOP RECORDER INSERTION N/A 01/23/2018   Procedure: LOOP RECORDER INSERTION;  Surgeon: Hillis Range, MD;  Location: [facility];  Service: Cardiovascular;  Laterality: N/A;   LOOP RECORDER REMOVAL  12/23/2019   MDT Reveal LINQ removed by Dr Johney Frame   Family History  Problem Relation Age of Onset   Heart attack  Mother 58   Diabetes Mother    Emphysema Father    Asthma Father    Heart attack Father 54   Heart attack Maternal Grandmother    Heart disease Paternal Grandmother    Heart disease Paternal Grandfather    Social History   Socioeconomic History   Marital status: Widowed    Spouse name: Not on file   Number of children: 1   Years of education: Not on file   Highest education level: Not on file  Occupational History   Occupation: Retired    Associate Professor: [employer]    Comment: Diplomatic Services operational officer  Tobacco Use   Smoking status: Never   Smokeless tobacco: Never  Vaping Use   Vaping Use: Never used  Substance and Sexual Activity   Alcohol use: Yes    Alcohol/week: 1.0 standard drink of alcohol    Types: 1 Glasses of wine per week     Comment: occasionally   Drug use: No   Sexual activity: Never  Other Topics Concern   Not on file  Social History Narrative   1 daughter, 1 grandchild   Lives in [place] 30 years    Social Determinants of Health   Financial Resource Strain: Not on file  Food Insecurity: Not on file  Transportation Needs: Not on file  Physical Activity: Not on file  Stress: Not on file  Social Connections: Not on file    Tobacco Counseling Counseling given: Not Answered   Clinical Intake:                 Diabetic? No         Activities of Daily Living     No data to display          Patient Care Team: Jerre Simon, MD as PCP - General (Family Medicine) Janalyn Harder, MD (Inactive) as Consulting Physician (Dermatology)  Indicate any recent Medical Services you may have received from other than [facility] providers in the past year (date may be approximate).     Assessment:   This is a routine wellness examination for [name].  Hearing/Vision screen Denies any hearing issues. Denies any vision changes. Annual Eye Exam  Dietary issues and exercise activities discussed:     Goals Addressed   None    Depression Screen    06/25/2022    2:33 PM 01/12/2022    1:56 PM 11/27/2021    8:42 AM 11/02/2021    8:41 AM 10/02/2021    2:13 PM 07/28/2021   11:32 AM 04/06/2021    1:37 PM  PHQ 2/9 Scores  PHQ - 2 Score 0 0 0 0 0 0 0  PHQ- 9 Score 0 0 0 0 1 1 0    Fall Risk    06/25/2022    2:37 PM 06/25/2022    2:32 PM 01/12/2022    1:56 PM 11/02/2021    8:38 AM 10/02/2021    2:13 PM  Fall Risk   Falls in the past year?  0 0 0 0  Number falls in past yr: 0  0 0 0  Injury with Fall?   0 0 0    FALL RISK PREVENTION PERTAINING TO THE HOME:  Any stairs in or around the home? {YES/NO:21197} If so, are there any without handrails? {YES/NO:21197} Home free of loose throw rugs in walkways, pet beds, electrical cords, etc? {YES/NO:21197} Adequate lighting in your home to reduce  risk of falls? {YES/NO:21197}  ASSISTIVE DEVICES UTILIZED TO PREVENT FALLS:  Life alert? {YES/NO:21197} Use of a cane, walker or w/c? {YES/NO:21197} Grab bars in the bathroom? {YES/NO:21197} Shower chair or bench in shower? {YES/NO:21197} Elevated toilet seat or a handicapped toilet? {YES/NO:21197}  TIMED UP AND GO:  Was the test performed? Unable to perform, virtual appointment   Cognitive Function:        Immunizations Immunization History  Administered Date(s) Administered   PNEUMOCOCCAL CONJUGATE-20 06/25/2022   Pneumococcal Polysaccharide-23 07/07/2019    TDAP status: Up to date  Flu Vaccine status: Due, Education has been provided regarding the importance of this vaccine. Advised may receive this vaccine at local pharmacy or Health Dept. Aware to provide a copy of the vaccination record if obtained from local pharmacy or Health Dept. Verbalized acceptance and understanding.  Pneumococcal vaccine status: Up to date  Covid-19 vaccine status: Information provided on how to obtain vaccines.   Qualifies for Shingles Vaccine? Yes   Zostavax completed No   Shingrix Completed?: No.    Education has been provided regarding the importance of this vaccine. Patient has been advised to call insurance company to determine out of pocket expense if they have not yet received this vaccine. Advised may also receive vaccine at local pharmacy or Health Dept. Verbalized acceptance and understanding.  Screening Tests Health Maintenance  Topic Date Due   Medicare Annual Wellness (AWV)  Never done   COVID-19 Vaccine (1) Never done   DTaP/Tdap/Td (1 - Tdap) Never done   Zoster Vaccines- Shingrix (1 of 2) Never done   DEXA SCAN  Never done   INFLUENZA VACCINE  10/28/2022 (Originally 02/27/2022)   Pneumonia Vaccine 79+ Years old  Completed   Hepatitis C Screening  Completed   HPV VACCINES  Aged Out    Health Maintenance  Health Maintenance Due  Topic Date Due   Medicare Annual  Wellness (AWV)  Never done   COVID-19 Vaccine (1) Never done   DTaP/Tdap/Td (1 - Tdap) Never done   Zoster Vaccines- Shingrix (1 of 2) Never done   DEXA SCAN  Never done    Colonoscopy: declined DEXa Scan: declined  Mammogram: declined     Lung Cancer Screening: (Low Dose CT Chest recommended if Age 56-80 years, 30 pack-year currently smoking OR have quit w/in 15years.) does not qualify.   Lung Cancer Screening Referral: not applicable   Additional Screening:  Hepatitis C Screening: does qualify; Completed 06/17/2019  Vision Screening: Recommended annual ophthalmology exams for early detection of glaucoma and other disorders of the eye. Is the patient up to date with their annual eye exam?  Yes  Who is the provider or what is the name of the office in which the patient attends annual eye exams? *** If pt is not established with a provider, would they like to be referred to a provider to establish care? No .   Dental Screening: Recommended annual dental exams for proper oral hygiene  Community Resource Referral / Chronic Care Management: CRR required this visit?  No   CCM required this visit?  No      Plan:     I have personally reviewed and noted the following in the patient's chart:   Medical and social history Use of alcohol, tobacco or illicit drugs  Current medications and supplements including opioid prescriptions. Patient is not currently taking opioid prescriptions. Functional ability and status Nutritional status Physical activity Advanced directives List of other physicians Hospitalizations, surgeries, and ER visits in previous 12 months Vitals Screenings to include cognitive, depression, and falls  Referrals and appointments  In addition, I have reviewed and discussed with patient certain preventive protocols, quality metrics, and best practice recommendations. A written personalized care plan for preventive services as well as general preventive health  recommendations were provided to patient.    Ms. Breitenstein , Thank you for taking time to come for your Medicare Wellness Visit. I appreciate your ongoing commitment to your health goals. Please review the following plan we discussed and let me know if I can assist you in the future.   These are the goals we discussed:  Goals   None     This is a list of the screening recommended for you and due dates:  Health Maintenance  Topic Date Due   COVID-19 Vaccine (1) Never done   DTaP/Tdap/Td vaccine (1 - Tdap) Never done   Zoster (Shingles) Vaccine (1 of 2) Never done   DEXA scan (bone density measurement)  Never done   Flu Shot  10/28/2022*   Medicare Annual Wellness Visit  10/22/2023   Pneumonia Vaccine  Completed   Hepatitis C Screening: USPSTF Recommendation to screen - Ages 18-79 yo.  Completed   HPV Vaccine  Aged Out  *Topic was postponed. The date shown is not the original due date.     Lonna Cobb, CMA   10/22/2022  Nurse Notes: Approximately 30 minute Non-Face -To-Face Medicare Wellness Visit

## 2022-10-22 ENCOUNTER — Ambulatory Visit (INDEPENDENT_AMBULATORY_CARE_PROVIDER_SITE_OTHER): Payer: Medicare PPO

## 2022-10-22 DIAGNOSIS — Z0000 Encounter for general adult medical examination without abnormal findings: Secondary | ICD-10-CM

## 2022-10-25 ENCOUNTER — Other Ambulatory Visit: Payer: Self-pay | Admitting: Student

## 2022-10-25 DIAGNOSIS — I10 Essential (primary) hypertension: Secondary | ICD-10-CM

## 2023-01-21 ENCOUNTER — Other Ambulatory Visit: Payer: Self-pay

## 2023-01-21 DIAGNOSIS — I10 Essential (primary) hypertension: Secondary | ICD-10-CM

## 2023-01-21 DIAGNOSIS — E785 Hyperlipidemia, unspecified: Secondary | ICD-10-CM

## 2023-01-22 MED ORDER — EZETIMIBE 10 MG PO TABS: 10.0000 mg | ORAL_TABLET | Freq: Every day | ORAL | 1 refills | Status: DC

## 2023-01-22 MED ORDER — LOSARTAN POTASSIUM 100 MG PO TABS
100.0000 mg | ORAL_TABLET | Freq: Every day | ORAL | 2 refills | Status: AC
Start: 2023-01-22 — End: ?

## 2023-02-12 ENCOUNTER — Ambulatory Visit: Payer: Medicare PPO | Admitting: Student

## 2023-02-12 ENCOUNTER — Encounter: Payer: Self-pay | Admitting: Student

## 2023-02-12 ENCOUNTER — Telehealth: Payer: Self-pay

## 2023-02-12 VITALS — BP 160/70 | HR 64 | Ht 65.0 in | Wt 124.0 lb

## 2023-02-12 DIAGNOSIS — I10 Essential (primary) hypertension: Secondary | ICD-10-CM | POA: Diagnosis not present

## 2023-02-12 MED ORDER — SPIRONOLACTONE 25 MG PO TABS
25.0000 mg | ORAL_TABLET | Freq: Every day | ORAL | 3 refills | Status: DC
Start: 2023-02-12 — End: 2023-03-25

## 2023-02-12 NOTE — Progress Notes (Cosign Needed Addendum)
    SUBJECTIVE:   CHIEF COMPLAINT / HPI:   Patient is a 77 year old female here for elevated blood pressure.  She is currently on losartan 100 mg and reports her home blood pressure readings have been elevated.  Previously tried amlodipine but discontinued due to BLE edema and was started on hydrochlorothiazide but discontinued due to concerns for bloating. Her BP home reading mostly systolics in the 150s with occasional increase to 170s/180s.  She endorses good compliance and tolerance to her high losartan.  Denies any chest pain, shortness of breath, dizziness or vision changes.   PERTINENT  PMH / PSH: Reviewed  OBJECTIVE:   BP (!) 160/70   Pulse 64   Ht 5\' 5"  (1.651 m)   Wt 124 lb (56.2 kg)   SpO2 100%   BMI 20.63 kg/m    Physical Exam General: Alert, well appearing, NAD Cardiovascular: RRR, No Murmurs, Normal S2/S2 Respiratory: CTAB, No wheezing or Rales Abdomen: No distension or tenderness Extremities: No edema on extremities     ASSESSMENT/PLAN:   Essential hypertension Blood pressure today is elevated x 2.  Still have some episodes of elevated ambulatory blood pressures.  Given her history of stroke we will need more optimized blood pressure control.  Will add spironolactone 25 mg daily to her home losartan 100 mg daily.  Patient verbalized understanding and agreeable to plan. Follow up in 2 weeks to check lab and monitor BP control.     Jerre Simon, MD [facility]

## 2023-02-12 NOTE — Assessment & Plan Note (Signed)
Blood pressure today is elevated x 2.  Still have some episodes of elevated ambulatory blood pressures.  Given her history of stroke we will need more optimized blood pressure control.  Will add spironolactone 25 mg daily to her home losartan 100 mg daily.  Patient verbalized understanding and agreeable to plan. Follow up in 2 weeks to check lab and monitor BP control.

## 2023-02-12 NOTE — Telephone Encounter (Signed)
LVM for patient to call office and schedule LAB visit per Dr. Elliot Gurney.  Glennie Hawk, CMA

## 2023-02-12 NOTE — Telephone Encounter (Signed)
-----   Message from Jerre Simon sent at 02/12/2023 12:50 PM EDT ----- Please can you call this patient to return in a week for lab since we started her on that new blood pressure medication today.  JN

## 2023-02-12 NOTE — Patient Instructions (Signed)
It was wonderful to see you today. Thank you for allowing me to be a part of your care. Below is a short summary of what we discussed at your visit today:  Your blood pressure today is slightly elevated.  Although your blood pressure mostly is in a good range it could benefit from better control.  I have added spironolactone which you will take 25 mg daily and continue your losartan 100 mg daily.  Please continue to check your blood pressure and follow-up with me in 2 weeks lets see what your blood pressure shows with the change.  Please bring all of your medications to every appointment!  If you have any questions or concerns, please do not hesitate to contact us via phone or MyChart message.   Jerre Simon, MD [facility]

## 2023-02-13 NOTE — Telephone Encounter (Signed)
Patient scheduled for labs for 7/23.

## 2023-02-14 ENCOUNTER — Encounter: Payer: Self-pay | Admitting: Student

## 2023-02-14 DIAGNOSIS — I10 Essential (primary) hypertension: Secondary | ICD-10-CM

## 2023-02-19 ENCOUNTER — Other Ambulatory Visit: Payer: Medicare PPO

## 2023-02-19 DIAGNOSIS — I10 Essential (primary) hypertension: Secondary | ICD-10-CM

## 2023-02-20 LAB — BASIC METABOLIC PANEL
BUN/Creatinine Ratio: 16 (ref 12–28)
BUN: 16 mg/dL (ref 8–27)
CO2: 26 mmol/L (ref 20–29)
Calcium: 9.9 mg/dL (ref 8.7–10.3)
Chloride: 104 mmol/L (ref 96–106)
Creatinine, Ser: 1.01 mg/dL — ABNORMAL HIGH (ref 0.57–1.00)
Glucose: 87 mg/dL (ref 70–99)
Potassium: 5.1 mmol/L (ref 3.5–5.2)
Sodium: 141 mmol/L (ref 134–144)
eGFR: 57 mL/min/{1.73_m2} — ABNORMAL LOW (ref >59)

## 2023-02-26 ENCOUNTER — Encounter: Payer: Self-pay | Admitting: Student

## 2023-02-26 ENCOUNTER — Ambulatory Visit (INDEPENDENT_AMBULATORY_CARE_PROVIDER_SITE_OTHER): Payer: Medicare PPO | Admitting: Student

## 2023-02-26 VITALS — BP 188/74 | HR 58 | Ht 65.0 in | Wt 124.0 lb

## 2023-02-26 DIAGNOSIS — I10 Essential (primary) hypertension: Secondary | ICD-10-CM | POA: Diagnosis not present

## 2023-02-26 DIAGNOSIS — F419 Anxiety disorder, unspecified: Secondary | ICD-10-CM | POA: Diagnosis not present

## 2023-02-26 NOTE — Progress Notes (Signed)
    SUBJECTIVE:   CHIEF COMPLAINT / HPI:   Patient is a 77 year old female presenting today for blood pressure follow-up.  She is currently on losartan and Aldactone was recently added.  Patient's blood pressure diary shows 110-130s systolic. Patient report compliance initially but stopped taking the Spirolactone in the last 3-4 days because it makes her fatigued and slow.    PERTINENT  PMH / PSH: Reviewed   OBJECTIVE:   BP (!) 188/74   Pulse (!) 58   Ht 5\' 5"  (1.651 m)   Wt 124 lb (56.2 kg)   SpO2 100%   BMI 20.63 kg/m    Flowsheet Row Office Visit from 02/26/2023 in [facility]  PHQ-9 Total Score 1        Physical Exam General: Alert, well appearing, NAD Cardiovascular: RRR, No Murmurs, Normal S2/S2 Respiratory: CTAB, No wheezing or Rales Abdomen: No distension or tenderness Extremities: No edema on extremities    ASSESSMENT/PLAN:   Essential hypertension BP elevated x2. Seems to have component of white coat hypertension. Ambulatory BP reading within appropriate range, also endorsed not taking her BP med this morning. Given ambulatory BP is appropriate and her assumed side effect from Spirolactone via shared decision agreed patient should discontinue spirolactone and continue Losartan. Encouraged continue BP check and follow up in a month   Stress Patient endorses feeling generally stressed mostly by "normal adult responsibilities". No signs of depression or anxiety with low PHQ9 and GAD 7 scores.  Patient declined therapy at this time to help with coping mechanism. She is agreeable to follow up in a month  Jerre Simon, MD [facility]

## 2023-02-26 NOTE — Assessment & Plan Note (Signed)
BP elevated x2. Seems to have component of white coat hypertension. Ambulatory BP reading within appropriate range, also endorsed not taking her BP med this morning. Given ambulatory BP is appropriate and her assumed side effect from Spirolactone via shared decision agreed patient should discontinue spirolactone and continue Losartan. Encouraged continue BP check and follow up in a month

## 2023-02-26 NOTE — Patient Instructions (Addendum)
It was wonderful to see you today. Thank you for allowing me to be a part of your care. Below is a short summary of what we discussed at your visit today:  Your blood pressure this morning is elevated.  Likely because you have not taken your medication this morning.  However it does look like your blood pressures at home is well-controlled.  You can continue your losartan and discontinue the spironolactone.  Follow-up with me in a month lets check your blood pressure and see how you are doing with your stress  Please bring all of your medications to every appointment!  If you have any questions or concerns, please do not hesitate to contact us via phone or MyChart message.   Jerre Simon, MD [facility]

## 2023-03-25 ENCOUNTER — Ambulatory Visit: Payer: Medicare PPO | Admitting: Student

## 2023-03-25 ENCOUNTER — Encounter: Payer: Self-pay | Admitting: Student

## 2023-03-25 VITALS — BP 138/62 | HR 62 | Wt 124.0 lb

## 2023-03-25 DIAGNOSIS — I10 Essential (primary) hypertension: Secondary | ICD-10-CM | POA: Diagnosis not present

## 2023-03-25 NOTE — Assessment & Plan Note (Signed)
BP today and ambulatory BPs are appropriate.  Blood pressure well-controlled. -Continue  losartan daily -Continue blood pressure logs -Follow-up in 6 months or earlier as needed.

## 2023-03-25 NOTE — Progress Notes (Signed)
    SUBJECTIVE:   CHIEF COMPLAINT / HPI:   Patient is a 77 year old female presenting for follow-up for blood pressure. Her home ambulatory blood pressure reading is ranging 130s-140s/80s. BP meds includes losartan 100 mg daily.   Patient endorses compliance to her blood pressure readings.   She has not had any headaches, dizziness, chest pain or shortness of breath.  PERTINENT  PMH / PSH: Reviewed  OBJECTIVE:   BP 138/62   Pulse 62   Wt 124 lb (56.2 kg)   SpO2 98%   BMI 20.63 kg/m    Physical Exam General: Pleasant, well appearing, NAD Cardiovascular: RRR, No Murmurs, Normal S2/S2 Respiratory: CTAB, No wheezing or Rales Abdomen: No distension or tenderness Extremities: No edema on extremities     ASSESSMENT/PLAN:   Essential hypertension BP today and ambulatory BPs are appropriate.  Blood pressure well-controlled. -Continue  losartan daily -Continue blood pressure logs -Follow-up in 6 months or earlier as needed.     Jerre Simon, MD [facility]

## 2023-03-25 NOTE — Patient Instructions (Signed)
It was wonderful to see you today. Thank you for allowing me to be a part of your care. Below is a short summary of what we discussed at your visit today:  Your blood pressure looks good.  Follow-up in 6 months or earlier as needed.  It is always great seeing you.  If you have any questions or concerns, please do not hesitate to contact us via phone or MyChart message.   Jerre Simon, MD [facility]

## 2023-04-29 ENCOUNTER — Other Ambulatory Visit: Payer: Self-pay | Admitting: Student

## 2023-04-29 DIAGNOSIS — I10 Essential (primary) hypertension: Secondary | ICD-10-CM

## 2023-07-12 ENCOUNTER — Other Ambulatory Visit: Payer: Self-pay | Admitting: Student

## 2023-07-12 DIAGNOSIS — I63512 Cerebral infarction due to unspecified occlusion or stenosis of left middle cerebral artery: Secondary | ICD-10-CM

## 2023-07-26 ENCOUNTER — Other Ambulatory Visit: Payer: Self-pay | Admitting: Student

## 2023-07-26 DIAGNOSIS — I10 Essential (primary) hypertension: Secondary | ICD-10-CM

## 2023-08-07 ENCOUNTER — Other Ambulatory Visit: Payer: Self-pay | Admitting: Student

## 2023-08-07 DIAGNOSIS — I63512 Cerebral infarction due to unspecified occlusion or stenosis of left middle cerebral artery: Secondary | ICD-10-CM

## 2023-09-24 ENCOUNTER — Encounter: Payer: Self-pay | Admitting: Student

## 2023-09-24 ENCOUNTER — Other Ambulatory Visit: Payer: Self-pay | Admitting: Student

## 2023-09-24 DIAGNOSIS — E785 Hyperlipidemia, unspecified: Secondary | ICD-10-CM

## 2023-10-14 ENCOUNTER — Ambulatory Visit: Payer: Medicare PPO | Admitting: Student

## 2023-10-14 ENCOUNTER — Encounter: Payer: Self-pay | Admitting: Student

## 2023-10-14 VITALS — BP 160/72 | HR 83 | Ht 65.0 in | Wt 127.6 lb

## 2023-10-14 DIAGNOSIS — I10 Essential (primary) hypertension: Secondary | ICD-10-CM | POA: Diagnosis not present

## 2023-10-14 NOTE — Progress Notes (Signed)
    SUBJECTIVE:   CHIEF COMPLAINT / HPI:   78 year old female with history of hypertension presenting today for blood pressure follow-up.  Currently on losartan 100 mg daily.  Home BP readings mostly in the 120s-150s/80s-90s.  Patient denies any headache, shortness of breath, chest pain or lower extremity swellings. Endorses good compliance to medication with good tolerance.  PERTINENT  PMH / PSH: Reviewed   OBJECTIVE:   BP (!) 160/72   Pulse 83   Ht 5\' 5"  (1.651 m)   Wt 127 lb 9.6 oz (57.9 kg)   SpO2 93%   BMI 21.23 kg/m    Physical Exam General: Alert, well appearing, NAD Cardiovascular: RRR, No Murmurs, Normal S2/S2 Respiratory: CTAB, No wheezing or Rales Abdomen: No distension or tenderness Extremities: No edema on extremities    ASSESSMENT/PLAN:   Essential hypertension BP today elevated. Home BP reading much better and within appropriate range for patient (120-150/75-90). Continue daily Losartan. Patient will continue frequent BP checks. Could consider referral to Dr. Raymondo Band for ambulatory BP if higher.     Jerre Simon, MD [facility]

## 2023-10-14 NOTE — Patient Instructions (Addendum)
 Pleasure to meet you today.  Your blood pressure reading have been appropriate.   He is continue to take your blood pressure medications as prescribed.  And if you start noticing decrease in your blood pressure or having dizziness or lightheadedness.  Please call to schedule an appointment to be seen.  Follow-up in 2 months.  Also you are due for Tdap, shingles vaccine and

## 2023-10-14 NOTE — Assessment & Plan Note (Signed)
 BP today elevated. Home BP reading much better and within appropriate range for patient (120-150/75-90). Continue daily Losartan. Patient will continue frequent BP checks. Could consider referral to Dr. Raymondo Band for ambulatory BP if higher.

## 2023-10-17 ENCOUNTER — Telehealth: Payer: Self-pay

## 2023-10-17 NOTE — Telephone Encounter (Signed)
 Patient LVM on nurse line regarding starting amlodipine. She states that she would like to try medication.   Attempted to call patient back to discuss further. She did not answer, LVM asking that she return call to office.   Per chart review, amlodipine was discontinued due to BLE edema. Patient is currently taking losartan 100 mg.   Dr. Elliot Gurney- was restarting amlodipine discussed at visit on 10/14/23 or should she schedule follow up with either you or Dr. Raymondo Band to further discuss.   Veronda Prude, RN

## 2023-10-21 NOTE — Telephone Encounter (Signed)
 Patient returns call to nurse line.  She reports she would like to restart Amlodipine. She reports she was on this medication "a while back" however stopped due to LE swelling.   She reports she would like PCP opinion on this or another BP medication to help manage.   She reports she is compliant with Losartan, however her numbers are averaging "in the 150s."  No vision changes changes, headaches, chest pains or shortness of breath.  Advised will forward to PCP.

## 2023-10-21 NOTE — Telephone Encounter (Signed)
 Called patient, unable to reach patient. Left a generic VM

## 2023-10-26 ENCOUNTER — Other Ambulatory Visit: Payer: Self-pay | Admitting: Student

## 2023-10-26 DIAGNOSIS — I10 Essential (primary) hypertension: Secondary | ICD-10-CM

## 2023-11-07 DIAGNOSIS — L2989 Other pruritus: Secondary | ICD-10-CM | POA: Diagnosis not present

## 2023-11-07 DIAGNOSIS — L538 Other specified erythematous conditions: Secondary | ICD-10-CM | POA: Diagnosis not present

## 2023-11-07 DIAGNOSIS — L821 Other seborrheic keratosis: Secondary | ICD-10-CM | POA: Diagnosis not present

## 2023-11-07 DIAGNOSIS — L570 Actinic keratosis: Secondary | ICD-10-CM | POA: Diagnosis not present

## 2023-11-07 DIAGNOSIS — L820 Inflamed seborrheic keratosis: Secondary | ICD-10-CM | POA: Diagnosis not present

## 2023-12-03 ENCOUNTER — Ambulatory Visit: Admitting: Student

## 2023-12-03 ENCOUNTER — Encounter: Payer: Self-pay | Admitting: Student

## 2023-12-03 VITALS — BP 164/74 | HR 61 | Ht 65.0 in | Wt 124.4 lb

## 2023-12-03 DIAGNOSIS — I10 Essential (primary) hypertension: Secondary | ICD-10-CM

## 2023-12-03 NOTE — Assessment & Plan Note (Addendum)
 BP today elevated more than home BP readings. Currently asymptomatic at this time. She have a component of whitecoat hypertension and given she has remained stable and tolerated her losartan  we will continue current medication regimen. Patient informed if BP above 150/90 persistently will return for to reassess.

## 2023-12-03 NOTE — Patient Instructions (Addendum)
 Pleasure to see you today.    Blood pressure was elevated however your home blood pressure readings have been appropriate.  These make sure to continue your home medications as prescribed You can get the Shingrix vaccine at the pharmacy. You will need a 2nd shot 2-6 months after the first. This vaccine is important to help prevent shingles.    Also you are due in for DEXA scan to check for osteoporosis.  We can schedule this whenever you are interested.

## 2023-12-03 NOTE — Progress Notes (Signed)
    SUBJECTIVE:   CHIEF COMPLAINT / HPI:   78 year old female with history of hypertension presenting today for blood pressure follow-up.  BP log shows blood pressure mostly ranging in the 130s-140s/70s-85.  Has been asymptomatic, he endorses good compliance and tolerance to her current medication.  In the past have had poor tolerance to amlodipine  due to lower extremity swelling, HCTZ and Spyro has been tried in the past which patient quested to be discontinued due to fatigue on those meds.  PERTINENT  PMH / PSH: Reviewed  OBJECTIVE:   BP (!) 164/74   Pulse 61   Ht 5\' 5"  (1.651 m)   Wt 124 lb 6.4 oz (56.4 kg)   SpO2 98%   BMI 20.70 kg/m    Physical Exam General: Alert, well appearing, NAD Cardiovascular: RRR, No Murmurs, Normal S2/S2 Respiratory: CTAB, No wheezing or Rales Abdomen: No distension or tenderness Extremities: No edema on extremities     ASSESSMENT/PLAN:   Essential hypertension BP today elevated more than home BP readings. Currently asymptomatic at this time. She have a component of whitecoat hypertension and given she has remained stable and tolerated her losartan  we will continue current medication regimen. Patient informed if BP above 150/90 persistently will return for to reassess.      Goble Last, MD [facility]

## 2023-12-26 ENCOUNTER — Other Ambulatory Visit: Payer: Self-pay | Admitting: Student

## 2023-12-26 DIAGNOSIS — I10 Essential (primary) hypertension: Secondary | ICD-10-CM

## 2024-03-19 ENCOUNTER — Other Ambulatory Visit: Payer: Self-pay | Admitting: Student

## 2024-03-19 DIAGNOSIS — E785 Hyperlipidemia, unspecified: Secondary | ICD-10-CM

## 2024-03-19 DIAGNOSIS — I10 Essential (primary) hypertension: Secondary | ICD-10-CM

## 2024-06-15 ENCOUNTER — Other Ambulatory Visit: Payer: Self-pay | Admitting: Student

## 2024-06-15 DIAGNOSIS — I63512 Cerebral infarction due to unspecified occlusion or stenosis of left middle cerebral artery: Secondary | ICD-10-CM

## 2024-06-18 ENCOUNTER — Other Ambulatory Visit: Payer: Self-pay

## 2024-06-18 DIAGNOSIS — I63512 Cerebral infarction due to unspecified occlusion or stenosis of left middle cerebral artery: Secondary | ICD-10-CM

## 2024-06-21 MED ORDER — ATORVASTATIN CALCIUM 80 MG PO TABS: ORAL_TABLET | ORAL | 3 refills | Status: AC

## 2024-06-26 ENCOUNTER — Other Ambulatory Visit: Payer: Self-pay | Admitting: Student

## 2024-06-26 DIAGNOSIS — I10 Essential (primary) hypertension: Secondary | ICD-10-CM
